# Patient Record
Sex: Female | Born: 1975 | Race: White | Hispanic: Yes | Marital: Married | Smoking: Never smoker
Health system: Southern US, Community
[De-identification: ages and names within clinical notes are randomized; demographics above are authoritative.]

## PROBLEM LIST (undated history)

## (undated) DIAGNOSIS — T7840XA Allergy, unspecified, initial encounter: Secondary | ICD-10-CM

## (undated) DIAGNOSIS — K8020 Calculus of gallbladder without cholecystitis without obstruction: Secondary | ICD-10-CM

## (undated) DIAGNOSIS — J3089 Other allergic rhinitis: Secondary | ICD-10-CM

## (undated) DIAGNOSIS — E669 Obesity, unspecified: Secondary | ICD-10-CM

## (undated) DIAGNOSIS — D509 Iron deficiency anemia, unspecified: Secondary | ICD-10-CM

## (undated) DIAGNOSIS — D1720 Benign lipomatous neoplasm of skin and subcutaneous tissue of unspecified limb: Secondary | ICD-10-CM

## (undated) DIAGNOSIS — E66811 Obesity, class 1: Secondary | ICD-10-CM

## (undated) DIAGNOSIS — E785 Hyperlipidemia, unspecified: Secondary | ICD-10-CM

## (undated) HISTORY — DX: Obesity, class 1: E66.811

## (undated) HISTORY — DX: Other allergic rhinitis: J30.89

## (undated) HISTORY — DX: Hyperlipidemia, unspecified: E78.5

## (undated) HISTORY — DX: Allergy, unspecified, initial encounter: T78.40XA

## (undated) HISTORY — DX: Obesity, unspecified: E66.9

## (undated) HISTORY — DX: Calculus of gallbladder without cholecystitis without obstruction: K80.20

## (undated) HISTORY — DX: Iron deficiency anemia, unspecified: D50.9

## (undated) HISTORY — DX: Benign lipomatous neoplasm of skin and subcutaneous tissue of unspecified limb: D17.20

---

## 2003-10-18 DIAGNOSIS — K8020 Calculus of gallbladder without cholecystitis without obstruction: Secondary | ICD-10-CM

## 2003-10-18 HISTORY — DX: Calculus of gallbladder without cholecystitis without obstruction: K80.20

## 2004-02-08 ENCOUNTER — Emergency Department (HOSPITAL_COMMUNITY): Admission: EM | Admit: 2004-02-08 | Discharge: 2004-02-08 | Payer: Self-pay | Admitting: Emergency Medicine

## 2004-06-28 ENCOUNTER — Encounter (INDEPENDENT_AMBULATORY_CARE_PROVIDER_SITE_OTHER): Payer: Self-pay | Admitting: Specialist

## 2004-06-28 ENCOUNTER — Inpatient Hospital Stay (HOSPITAL_COMMUNITY): Admission: RE | Admit: 2004-06-28 | Discharge: 2004-07-01 | Payer: Self-pay | Admitting: Obstetrics

## 2004-08-02 ENCOUNTER — Emergency Department (HOSPITAL_COMMUNITY): Admission: EM | Admit: 2004-08-02 | Discharge: 2004-08-03 | Payer: Self-pay | Admitting: *Deleted

## 2004-09-20 ENCOUNTER — Ambulatory Visit (HOSPITAL_COMMUNITY): Admission: RE | Admit: 2004-09-20 | Discharge: 2004-09-21 | Payer: Self-pay | Admitting: General Surgery

## 2004-09-20 ENCOUNTER — Encounter (INDEPENDENT_AMBULATORY_CARE_PROVIDER_SITE_OTHER): Payer: Self-pay | Admitting: Specialist

## 2008-02-15 LAB — CONVERTED CEMR LAB: Pap Smear: NORMAL

## 2008-05-22 ENCOUNTER — Ambulatory Visit: Payer: Self-pay | Admitting: Nurse Practitioner

## 2008-05-22 DIAGNOSIS — K029 Dental caries, unspecified: Secondary | ICD-10-CM

## 2009-01-13 ENCOUNTER — Telehealth (INDEPENDENT_AMBULATORY_CARE_PROVIDER_SITE_OTHER): Payer: Self-pay | Admitting: *Deleted

## 2009-01-22 ENCOUNTER — Ambulatory Visit: Payer: Self-pay | Admitting: Nurse Practitioner

## 2009-01-22 DIAGNOSIS — J302 Other seasonal allergic rhinitis: Secondary | ICD-10-CM

## 2009-01-22 DIAGNOSIS — J029 Acute pharyngitis, unspecified: Secondary | ICD-10-CM

## 2009-01-22 LAB — CONVERTED CEMR LAB: Rapid Strep: NEGATIVE

## 2009-02-06 ENCOUNTER — Encounter (INDEPENDENT_AMBULATORY_CARE_PROVIDER_SITE_OTHER): Payer: Self-pay | Admitting: Nurse Practitioner

## 2009-02-06 ENCOUNTER — Ambulatory Visit: Payer: Self-pay | Admitting: Nurse Practitioner

## 2009-02-06 LAB — CONVERTED CEMR LAB
Bilirubin Urine: NEGATIVE
Blood in Urine, dipstick: NEGATIVE
Glucose, Urine, Semiquant: NEGATIVE
KOH Prep: NEGATIVE
Ketones, urine, test strip: NEGATIVE
Nitrite: NEGATIVE
Protein, U semiquant: NEGATIVE
Specific Gravity, Urine: >=1.03
Urobilinogen, UA: 0.2
pH: 5

## 2009-02-09 ENCOUNTER — Encounter (INDEPENDENT_AMBULATORY_CARE_PROVIDER_SITE_OTHER): Payer: Self-pay | Admitting: Nurse Practitioner

## 2009-02-09 DIAGNOSIS — D649 Anemia, unspecified: Secondary | ICD-10-CM

## 2009-02-09 LAB — CONVERTED CEMR LAB: Retic Ct Pct: 2 % (ref 0.4–3.1)

## 2009-02-10 ENCOUNTER — Encounter (INDEPENDENT_AMBULATORY_CARE_PROVIDER_SITE_OTHER): Payer: Self-pay | Admitting: Nurse Practitioner

## 2009-02-10 LAB — CONVERTED CEMR LAB
ALT: 20 units/L (ref 0–35)
AST: 17 units/L (ref 0–37)
Albumin: 4.6 g/dL (ref 3.5–5.2)
Alkaline Phosphatase: 63 units/L (ref 39–117)
BUN: 11 mg/dL (ref 6–23)
Basophils Absolute: 0 10*3/uL (ref 0.0–0.1)
Basophils Relative: 0 % (ref 0–1)
CO2: 23 meq/L (ref 19–32)
Calcium: 9.7 mg/dL (ref 8.4–10.5)
Chlamydia, DNA Probe: NEGATIVE
Chloride: 104 meq/L (ref 96–112)
Cholesterol: 135 mg/dL (ref 0–200)
Creatinine, Ser: 0.57 mg/dL (ref 0.40–1.20)
Eosinophils Absolute: 0.3 10*3/uL (ref 0.0–0.7)
Eosinophils Relative: 4 % (ref 0–5)
GC Probe Amp, Genital: NEGATIVE
Glucose, Bld: 89 mg/dL (ref 70–99)
HCT: 32.9 % — ABNORMAL LOW (ref 36.0–46.0)
HDL: 37 mg/dL — ABNORMAL LOW (ref >39)
Hemoglobin: 10.4 g/dL — ABNORMAL LOW (ref 12.0–15.0)
LDL Cholesterol: 68 mg/dL (ref 0–99)
Lymphocytes Relative: 41 % (ref 12–46)
Lymphs Abs: 2.8 10*3/uL (ref 0.7–4.0)
MCHC: 31.6 g/dL (ref 30.0–36.0)
MCV: 57.4 fL — ABNORMAL LOW (ref 78.0–100.0)
Monocytes Absolute: 0.5 10*3/uL (ref 0.1–1.0)
Monocytes Relative: 8 % (ref 3–12)
Neutro Abs: 3.3 10*3/uL (ref 1.7–7.7)
Neutrophils Relative %: 48 % (ref 43–77)
Platelets: 264 10*3/uL (ref 150–400)
Potassium: 4.1 meq/L (ref 3.5–5.3)
RBC: 5.73 M/uL — ABNORMAL HIGH (ref 3.87–5.11)
RDW: 15.9 % — ABNORMAL HIGH (ref 11.5–15.5)
Sodium: 138 meq/L (ref 135–145)
TSH: 3.436 microintl units/mL (ref 0.350–4.500)
Total Bilirubin: 0.6 mg/dL (ref 0.3–1.2)
Total CHOL/HDL Ratio: 3.6
Total Protein: 7.8 g/dL (ref 6.0–8.3)
Triglycerides: 152 mg/dL — ABNORMAL HIGH (ref <150)
VLDL: 30 mg/dL (ref 0–40)
WBC: 7 10*3/uL (ref 4.0–10.5)

## 2009-10-17 HISTORY — PX: TUBAL LIGATION: SHX77

## 2010-09-24 ENCOUNTER — Ambulatory Visit: Payer: Self-pay | Admitting: Obstetrics & Gynecology

## 2010-09-24 ENCOUNTER — Encounter: Payer: Self-pay | Admitting: Obstetrics & Gynecology

## 2010-09-24 LAB — CONVERTED CEMR LAB
HCT: 32.6 % — ABNORMAL LOW (ref 36.0–46.0)
Hemoglobin: 10.3 g/dL — ABNORMAL LOW (ref 12.0–15.0)
MCHC: 31.6 g/dL (ref 30.0–36.0)
MCV: 59 fL — ABNORMAL LOW (ref 78.0–100.0)
Pap Smear: NEGATIVE
Platelets: 253 10*3/uL (ref 150–400)
RBC: 5.53 M/uL — ABNORMAL HIGH (ref 3.87–5.11)
RDW: 15.3 % (ref 11.5–15.5)
WBC: 6.3 10*3/uL (ref 4.0–10.5)

## 2010-09-29 ENCOUNTER — Ambulatory Visit (HOSPITAL_COMMUNITY)
Admission: RE | Admit: 2010-09-29 | Discharge: 2010-09-29 | Payer: Self-pay | Source: Home / Self Care | Attending: Family Medicine | Admitting: Family Medicine

## 2010-10-14 ENCOUNTER — Ambulatory Visit: Payer: Self-pay | Admitting: Obstetrics & Gynecology

## 2010-10-23 ENCOUNTER — Emergency Department (HOSPITAL_COMMUNITY)
Admission: EM | Admit: 2010-10-23 | Discharge: 2010-10-23 | Payer: Self-pay | Source: Home / Self Care | Admitting: Emergency Medicine

## 2011-03-04 NOTE — Op Note (Signed)
NAMEBENTLEY, HARALSON NO.:  [id number]   MEDICAL RECORD NO.:  [id number]          PATIENT TYPE:  OIB   LOCATION:  2859                         FACILITY:  [facility]   PHYSICIAN:  Gita Kudo, M.D. DATE OF BIRTH:  [birth year]   DATE OF PROCEDURE:  DATE OF DISCHARGE:                                 OPERATIVE REPORT   PREOPERATIVE DIAGNOSIS:  Cholecystitis, gallstones.   POSTOPERATIVE DIAGNOSIS:  Cholecystitis, gallstones.   OPERATION PERFORMED:  Laparoscopic cholecystectomy with intraoperative  cholangiogram.   SURGEON:  Gita Kudo, M.D.   ASSISTANT:  Rose Phi. Maple Hudson, M.D.   ANESTHESIA:  General endotracheal.   INDICATIONS FOR PROCEDURE:  Gallstones.   CLINICAL SUMMARY:  The patient is a 35 year old female brought in for  elective cholecystectomy.  She has had abdominal pain and gallbladder  ultrasound showed stones.  Liver functions are normal.   OPERATIVE FINDINGS:  The gallbladder was thin walled and had a few adhesions  around it.  The cystic duct and artery were normal in anatomy.  The  cholangiogram looked normal.   DESCRIPTION OF PROCEDURE:  Under satisfactory general endotracheal  anesthesia, having received 1 g Ancef preop, the patient was positioned,  prepped and draped in a standard fashion.  A total of 30 mL of 0.5% Marcaine  with epinephrine was infiltrated for postoperative analgesia.  Then through  the infiltrated skin sites, a transverse incision was made above the  umbilicus, midline opened into the peritoneum and controlled with a figure-  of-eight 0 Vicryl.  Operating Hasson port inserted, secured and good CO2  pneumoperitoneum established.  Under direct vision, two #5 ports placed  laterally and a second #10 medially.  Lateral port graspers gave excellent  exposure and we identified the gallbladder and adhesions and took the  adhesions down carefully.  Then the cystic duct and artery were each  identified and circumferentially  dissected.  Once certain of the anatomy,  the artery was controlled with multiple clips and a single clip placed on  the cystic duct near the gallbladder.  A percutaneous cholangiogram was then  inserted into the duct and a good film obtained.  Following this, the  catheter was withdrawn and the duct controlled with multiple clips and it  and the artery divided.  The gallbladder was then removed from below upward  using the coagulating hook for both hemostasis and dissection.  After the  gallbladder was removed, the liver bed was checked for hemostasis, made dry  by cautery, lavaged with saline and suctioned away.   Camera moved to the upper port and through the lower port, a large grasper  used to retrieve the gallbladder intact and without spillage or problem.  Then the operative site again checked, lavaged and suctioned.  Ports and CO2  released.  Midline closed with a previous figure-of-eight and a second  interrupted 0 Vicryl.  Subcutaneous approximated with 4-0 Vicryl and Steri-  Strips applied to all incisions.  Sterile absorbent dressings applied and  the patient went to the recovery room from the operating room in good  condition without complication.  MRL/MEDQ  D:  09/20/2004  T:  09/20/2004  Job:  [id number]

## 2011-03-04 NOTE — Op Note (Signed)
Nancy Howard, Nancy Howard                     ACCOUNT NO.:  [id number]   MEDICAL RECORD NO.:  [id number]                   PATIENT TYPE:  INP   LOCATION:  9123                                 FACILITY:  [facility]   PHYSICIAN:  Kathreen Cosier, M.D.           DATE OF BIRTH:  06-05-1976   DATE OF PROCEDURE:  06/28/2004  DATE OF DISCHARGE:                                 OPERATIVE REPORT   PREOPERATIVE DIAGNOSIS:  Previous cesarean section at term, desires repeat  and tubal ligation.   POSTOPERATIVE DIAGNOSIS:  Previous cesarean section at term, desires repeat  and tubal ligation.   OPERATION PERFORMED:   SURGEON:  Kathreen Cosier, M.D.   ASSISTANT:  Bing Neighbors. Clearance Coots, M.D.   ANESTHESIA:  Spinal.   DESCRIPTION OF PROCEDURE:  The patient was placed on the operating table in  supine position after the spinal administered.  The abdomen was prepped and  draped.  Bladder emptied with a Foley catheter.  Transverse suprapubic  incision made through the old scar, carried out onto the rectus fascia.  Fascia cleaned and incised the length of the incision.  Rectus muscles  retracted laterally.  Peritoneum incised longitudinally.  Transverse  incision made in the visceral peritoneum above the bladder.  Bladder  mobilized inferiorly.  Transverse lower uterine incision was made.  Patient  delivered from the OA position of a female, Apgar 8 and 9 weighing 7 pounds  and 5 ounces.  The placenta was posterior.  Removed manually, uterine cavity  cleaned with dry laps.  The uterine incision closed with one layer with  continuous suture of #1 chromic.  Hemostasis satisfactory.  The right tube  was grasped in midportion with a Babcock clamp, 0 plain suture placed in the  mesosalpinx below of the portion of tube and then clamped.  This was tied  and the tube cut.  Approximately one inch of tube was removed  Procedure  done in a similar fashion on the other side.  Lap and sponge count correct.  Abdomen closed in layers.  Peritoneum continuous suture of 0 chromic, fascia  continuous suture of 0 Dexon and the skin closed with subcuticular stitch of  3-0 Monocryl.  Blood loss 500 mL.                                               Kathreen Cosier, M.D.    BAM/MEDQ  D:  06/28/2004  T:  06/28/2004  Job:  [id number]

## 2011-03-04 NOTE — Discharge Summary (Signed)
NAMESHARICE, HARRISS                     ACCOUNT NO.:  [id number]   MEDICAL RECORD NO.:  [id number]                   PATIENT TYPE:  INP   LOCATION:  9123                                 FACILITY:   PHYSICIAN:  Kathreen Cosier, M.D.           DATE OF BIRTH:  07-18-1976   DATE OF ADMISSION:  06/28/2004  DATE OF DISCHARGE:  07/01/2004                                 DISCHARGE SUMMARY   The patient is a 34 year old, gravida 3, para 2-0-0-2 with two previous C-  sections.  Her EDC was on June 29, 2004 and she was admitted for a  repeat C-section and bilateral tubal ligation.  On June 28, 2004 she  underwent repeat low transverse cesarean section.  She had a female with  Apgar's of 8 and 9 from the OA position weighing 7 pounds 5 ounces.  She had  a bilateral tubal ligation performed.  Postoperatively, she did well.  She  was discharged on the third postoperative day, ambulatory, on a regular  diet, to see me in six weeks.   DISCHARGE DIAGNOSIS:  Status post repeat low transverse cesarean section and  bilateral tubal ligation, at term.                                               Kathreen Cosier, M.D.    BAM/MEDQ  D:  07/01/2004  T:  07/01/2004  Job:  [id number]

## 2012-10-22 ENCOUNTER — Ambulatory Visit: Payer: Self-pay | Admitting: Obstetrics and Gynecology

## 2012-10-29 ENCOUNTER — Encounter: Payer: Self-pay | Admitting: Advanced Practice Midwife

## 2012-10-29 ENCOUNTER — Ambulatory Visit (INDEPENDENT_AMBULATORY_CARE_PROVIDER_SITE_OTHER): Payer: Self-pay | Admitting: Advanced Practice Midwife

## 2012-10-29 VITALS — BP 158/95 | HR 71 | Temp 96.7°F | Ht 60.0 in | Wt 134.5 lb

## 2012-10-29 DIAGNOSIS — Z0000 Encounter for general adult medical examination without abnormal findings: Secondary | ICD-10-CM

## 2012-10-29 NOTE — Progress Notes (Signed)
Patient ID: Nancy Howard, female   DOB: 11/26/1975, 37 y.o.   MRN: [id number] Pt here for breast exam and pap today. No h/o abnl pap. Denies concerns. Offered patient option of going to free cancer screening coming up at [facility] on 11/19/12 instead, considering that she has no insurance. Pt states she would like to do that. No exam done today.

## 2012-10-29 NOTE — Patient Instructions (Signed)
Call [phone]

## 2013-11-08 ENCOUNTER — Ambulatory Visit: Payer: Self-pay | Admitting: Medical

## 2014-08-18 ENCOUNTER — Encounter: Payer: Self-pay | Admitting: Advanced Practice Midwife

## 2014-08-27 ENCOUNTER — Encounter: Payer: Self-pay | Admitting: Obstetrics & Gynecology

## 2014-08-27 ENCOUNTER — Ambulatory Visit (INDEPENDENT_AMBULATORY_CARE_PROVIDER_SITE_OTHER): Payer: Self-pay | Admitting: Obstetrics & Gynecology

## 2014-08-27 VITALS — BP 124/89 | HR 73 | Temp 97.9°F | Resp 20 | Ht <= 58 in | Wt 134.3 lb

## 2014-08-27 DIAGNOSIS — R102 Pelvic and perineal pain: Secondary | ICD-10-CM

## 2014-08-27 MED ORDER — IBUPROFEN 600 MG PO TABS: 600.0000 mg | ORAL_TABLET | Freq: Four times a day (QID) | ORAL | Status: DC | PRN

## 2014-08-27 NOTE — Progress Notes (Signed)
[place] # [phone] used for nursing check in of medical information.  Zenda Alpers present for exam and visit with Dr. Ihor Dow

## 2014-08-27 NOTE — Patient Instructions (Signed)
Fibroma uterino (Uterine Fibroid) Un fibroma uterino es un crecimiento (tumor) dentro del tero. Este tipo de tumor no es canceroso y no se extiende fuera del tero. Podr tener uno o varios fibromas. Los fibromas pueden variar en tamao, peso y el lugar en que se desarrollan dentro del tero. Algunos pueden llegar a ser bastante grandes. La mayora de los fibromas no necesitan tratamiento mdico, pero algunos pueden causar dolor o sangrado abundante durante los perodos y entre ellos. CAUSAS  Un fibroma es el resultado del desarrollo continuo de una nica clula uterina que sigue creciendo (no regulada) que es diferente al resto de las clulas del cuerpo humano. La mayora de las clulas tiene un mecanismo de control que evita que se reproduzcan de manera descontrolada.  SIGNOS Y SNTOMAS   Hemorragias.  Dolor y sensacin de presin en la pelvis.  Problemas en la vejiga debido al tamao del fibroma.  Infertilidad y abortos espontneos, segn el tamao y la ubicacin del fibroma. DIAGNSTICO  Los fibromas uterinos se diagnostican con un examen fsico. El mdico puede palpar los tumores abultados al realizar el examen de la pelvis. Una ecografa puede indicarse para tener informacin del tamao, la ubicacin y el nmero de tumores.  TRATAMIENTO   El mdico puede considerar que es conveniente esperar y prestar atencin. Esto incluye el control del fibroma por parte del mdico para observar si crece o disminuye su tamao.  Podr indicarle un tratamiento hormonal o el uso de un dispositivo intrauterino (DIU).  En algunos casos es necesaria la ciruga para extirpar el fibroma (miomectoma) o el tero (histerectoma). Esto depender de su situacin. Cuando una mujer desea quedar embarazada y los fibromas interfieren en su fertilidad, el mdico puede recomendar la extirpacin del fibroma.  INSTRUCCIONES PARA EL CUIDADO EN EL HOGAR  Los cuidados en el hogar dependen del tratamiento que haya  recibido. En general:   Cumpla con todas las visitas de control, segn le indique su mdico.  Tome slo medicamentos de venta libre o recetados, segn las indicaciones del mdico. Si le recetaron un tratamiento hormonal, tome los medicamentos hormonales como le indicaron. No tome aspirina. Puede ocasionar hemorragias.  Consulte al mdico si debe tomar pldoras de hierro.  Si sus perodos son molestos pero no tan abundantes, acustese con los pies ligeramente elevados por encima del nivel del corazn. Coloque compresas fras en la zona inferior del abdomen.  Si sus perodos son muy abundantes, anote el nmero de compresas o tampones que usa cada mes. Lleve esta informacin a su consulta mdica.  Incluya vegetales verdes en su dieta. SOLICITE ATENCIN MDICA DE INMEDIATO SI:  Siente dolor o clicos en la pelvis y no puede controlarlos con los medicamentos.  El dolor en la pelvis aumenta de manera repentina.  Aumenta el sangrado entre los perodos o durante los mismos.  Si tiene perodos muy abundantes y debe cambiar un tampn o una toalla higinica cada media hora o menos.  Se siente mareado o tiene episodios de desmayo. Document Released: 10/03/2005 Document Revised: 07/24/2013 ExitCare Patient Information 2015 [employer]. This information is not intended to replace advice given to you by your health care provider. Make sure you discuss any questions you have with your health care provider.  

## 2014-08-27 NOTE — Progress Notes (Signed)
Subjective:     Patient ID: Nancy Howard, female   DOB: 06-14-1976, 38 y.o.   MRN: [id number]  HPI Pt c/o pain in her pelvis.  She reports that it is intermittent.  It has occurred for 1 year. It is not worse with menses or intercourse.  Pt has had a BTL.  Pt denies vaginal discharge.  She reports that menses are irregular with cycles usually occuring 2-3 months.   This has occurred since menarche.  She denies heavy bleeding when she is having menses.        Review of Systems     Objective:   Physical Exam BP 124/89 mmHg  Pulse 73  Temp(Src) 97.9 F (36.6 C) (Oral)  Resp 20  Ht 4' 9.5" (1.461 m)  Wt 134 lb 4.8 oz (60.918 kg)  BMI 28.54 kg/m2  LMP 08/10/2014 (Exact Date) Lungs: CTA CV: RRR Abd: soft, NT; ND.  Well healed transverse incision with small infraumbilical incision GU: EGBUS: no lesions Vagina: no blood in vault Cervix: no lesion; no mucopurulent d/c Uterus: enlarged- difficult to assess due to tense abd Adnexa: no masses; non tender          Assessment:     Pelvic pain; suspect uterine fibroids     Plan:     Motrin 600mg  prn q 6 hours with food Pelvic sono F/u in 4 weeks or sooner prn

## 2014-08-28 ENCOUNTER — Ambulatory Visit (HOSPITAL_COMMUNITY)
Admission: RE | Admit: 2014-08-28 | Discharge: 2014-08-28 | Disposition: A | Discharge status: Home / Self Care | Payer: Self-pay | Source: Ambulatory Visit | Attending: Obstetrics & Gynecology | Admitting: Obstetrics & Gynecology

## 2014-08-28 DIAGNOSIS — R102 Pelvic and perineal pain: Secondary | ICD-10-CM

## 2014-08-28 DIAGNOSIS — D259 Leiomyoma of uterus, unspecified: Secondary | ICD-10-CM | POA: Insufficient documentation

## 2014-10-01 ENCOUNTER — Ambulatory Visit: Payer: Self-pay | Admitting: Obstetrics & Gynecology

## 2014-11-14 ENCOUNTER — Encounter: Payer: Self-pay | Admitting: Obstetrics & Gynecology

## 2014-11-14 ENCOUNTER — Ambulatory Visit (INDEPENDENT_AMBULATORY_CARE_PROVIDER_SITE_OTHER): Payer: Self-pay | Admitting: Obstetrics & Gynecology

## 2014-11-14 VITALS — BP 110/74 | HR 73 | Temp 98.0°F | Ht <= 58 in | Wt 139.0 lb

## 2014-11-14 DIAGNOSIS — D250 Submucous leiomyoma of uterus: Secondary | ICD-10-CM

## 2014-11-14 NOTE — Patient Instructions (Signed)
Fibroma uterino (Uterine Fibroid) Un fibroma uterino es un crecimiento (tumor) dentro del tero. Este tipo de tumor no es Radio broadcast assistant y no se extiende fuera del tero. Podr tener uno o varios fibromas. Los fibromas pueden variar en tamao, peso y TEFL teacher en que se desarrollan dentro del tero. Algunos pueden llegar a ser bastante grandes. La mayora de los fibromas no necesitan tratamiento mdico, pero algunos pueden causar dolor o sangrado abundante durante los perodos y [place]. CAUSAS  Un fibroma es el resultado del desarrollo continuo de una nica clula uterina que sigue creciendo (no regulada) que es diferente al resto de las clulas del cuerpo humano. La mayora de las clulas tiene un mecanismo de control que evita que se reproduzcan de Research officer, trade union.  SIGNOS Y SNTOMAS   Hemorragias.  Dolor y sensacin de presin en la pelvis.  Problemas en la vejiga debido al tamao del fibroma.  Infertilidad y abortos espontneos, segn el tamao y la ubicacin del fibroma. DIAGNSTICO  Los fibromas uterinos se diagnostican con un examen fsico. El mdico puede palpar los tumores abultados al realizar el examen de la pelvis. Una ecografa puede indicarse para tener informacin del tamao, la ubicacin y el nmero de tumores.  TRATAMIENTO   El mdico puede considerar que es conveniente esperar y Barrister's clerk. Esto incluye el control del fibroma por parte del mdico para observar si crece o disminuye su tamao.  Podr indicarle un tratamiento hormonal o el uso de un dispositivo intrauterino (DIU).  En algunos casos es necesaria la ciruga para extirpar el fibroma (miomectoma) o el tero (histerectoma). Esto depender de su situacin. Cuando una mujer desea quedar embarazada y los fibromas interfieren en su fertilidad, el mdico puede recomendar la extirpacin del fibroma.  INSTRUCCIONES PARA EL CUIDADO EN EL HOGAR  Los cuidados en el hogar dependen del tratamiento que haya  recibido. En general:   Cumpla con todas las visitas de control, segn le indique su mdico.  Tome slo medicamentos de venta libre o recetados, segn las indicaciones del mdico. Si le recetaron un tratamiento hormonal, tome los medicamentos hormonales como le indicaron. No tome aspirina. Puede ocasionar hemorragias.  Consulte al mdico si debe tomar pldoras de hierro.  Si sus perodos son molestos pero no tan abundantes, acustese con los pies ligeramente elevados por encima del nivel del corazn. Coloque compresas fras en la zona inferior del abdomen.  Si sus perodos son muy abundantes, anote el nmero de compresas o tampones que [place] cada mes. Lleve esta informacin a su consulta mdica.  Incluya vegetales verdes en su dieta. SOLICITE ATENCIN MDICA DE INMEDIATO SI:  Siente dolor o clicos en la pelvis y no puede controlarlos con los medicamentos.  El dolor en la pelvis aumenta de manera repentina.  Aumenta el sangrado entre los perodos o [employer].  Si tiene perodos muy abundantes y debe cambiar un tampn o una toalla higinica cada media hora o menos.  Se siente mareado o tiene episodios de [place]. Document Released: 10/03/2005 Document Revised: 07/24/2013 [facility] Patient Information 2015 [place], [place]. This information is not intended to replace advice given to you by your health care provider. Make sure you discuss any questions you have with your health care provider.

## 2014-11-14 NOTE — Progress Notes (Signed)
Patient reports she has not had any more pelvic pain. Language Line Spanish interpreter used.

## 2014-11-14 NOTE — Progress Notes (Signed)
Subjective:     Patient ID: Nancy Howard, female   DOB: [birth year], 39 y.o.   MRN: [id number]  [id number] Patient's last menstrual period was 11/13/2014 (exact date). NO pain or menorrhagia now. US showed submucosal fibroid  No past medical history on file. Past Surgical History  Procedure Laterality Date  . Tubal ligation    . Cesarean section  06/10/1998, 01/29/2003    06/28/2004   No Known Allergies  Review of Systems  Constitutional: Negative.   Gastrointestinal: Negative.   Genitourinary: Positive for vaginal bleeding. Negative for flank pain and menstrual problem.       Objective:   Physical Exam  Constitutional: She is oriented to person, place, and time. She appears well-developed. No distress.  Neurological: She is alert and oriented to person, place, and time.  Psychiatric: She has a normal mood and affect. Her behavior is normal.      CLINICAL DATA: Pelvic pain x1 year, irregular menses  EXAM: TRANSABDOMINAL AND TRANSVAGINAL ULTRASOUND OF PELVIS  TECHNIQUE: Both transabdominal and transvaginal ultrasound examinations of the pelvis were performed. Transabdominal technique was performed for global imaging of the pelvis including uterus, ovaries, adnexal regions, and pelvic cul-de-sac. It was necessary to proceed with endovaginal exam following the transabdominal exam to visualize the endometrium.  COMPARISON: 09/29/2010  FINDINGS: Uterus  Measurements: 10.3 x 5.3 x 6.2 cm. 4.0 x 3.0 x 3.0 cm submucosal fibroid in the right uterine fundus.  Endometrium  Thickness: 10 mm. No focal abnormality visualized.  Right ovary  Measurements: 1.9 x 1.3 x 2.7 cm. Normal appearance/no adnexal mass.  Left ovary  Measurements: 3.2 x 2.0 x 2.0 cm. Normal appearance/no adnexal mass.  Other findings  No free fluid.  IMPRESSION: 4.0 cm submucosal fibroid in the right uterine fundus.  Endometrial complex measures 10  mm.   Electronically Signed  By: Julian Hy M.D.  On: 08/28/2014 14:52 Assessment:     Fibroid uterus no sx now     Plan:     F/u 6 months  Woodroe Mode, MD 11/14/2014

## 2015-08-13 ENCOUNTER — Ambulatory Visit: Payer: Self-pay | Admitting: Internal Medicine

## 2015-08-28 ENCOUNTER — Encounter: Payer: Self-pay | Admitting: Internal Medicine

## 2015-08-28 ENCOUNTER — Ambulatory Visit (INDEPENDENT_AMBULATORY_CARE_PROVIDER_SITE_OTHER): Payer: No Typology Code available for payment source | Admitting: Internal Medicine

## 2015-08-28 VITALS — BP 134/90 | HR 78 | Ht <= 58 in | Wt 139.0 lb

## 2015-08-28 DIAGNOSIS — R42 Dizziness and giddiness: Secondary | ICD-10-CM

## 2015-08-28 NOTE — Progress Notes (Signed)
   Subjective:    Patient ID: Nancy Howard, female    DOB: 04/23/1976, 39 y.o.   MRN: [id number]  HPI  1.  Dizziness:  2 episodes.  First about 1 month ago.  First episode occurred in the morning after taking a nap following getting her kids off to school.  Stood up from bed and felt like the room was spinning.  Lasted less than a minute, she sat down and resolved. Second episode was a week ago.  Was sitting and went to stand, does not recall if she turned her head one way or the other, and had again episode of spinning that lasted less than a minute.  Sat back down and quickly resolved.   Had a left ear infection about 3 months ago, treated with antibiotics.  Has not had any congestion, itchy watery nose, eyes, throat more recently.   No loss of hearing, no nausea or vomiting with dizziness.  No family history of hearing loss.  2.  Elevated BP:  Has never been told her bp is high.  She is anxious today.  Gets anxious coming to a doctor.      Review of Systems     Objective:   Physical Exam  Constitutional: She is oriented to person, place, and time. She appears well-developed and well-nourished.  HENT:  Head: Normocephalic and atraumatic.  Right Ear: Tympanic membrane normal.  Left Ear: No swelling. Tympanic membrane is retracted. Tympanic membrane is not erythematous.  Nose: No mucosal edema or rhinorrhea.  Mouth/Throat: Uvula is midline, oropharynx is clear and moist and mucous membranes are normal.  Eyes: Conjunctivae and EOM are normal. Pupils are equal, round, and reactive to light.  Discs sharp  Neck: Normal range of motion. Neck supple. No thyromegaly present.  Cardiovascular: Normal rate, regular rhythm and normal heart sounds.   No carotid bruits Carotid, radial and DP pulse normal and equal  Pulmonary/Chest: Effort normal and breath sounds normal.  Lymphadenopathy:    She has no cervical adenopathy.  Neurological: She is alert and oriented to person, place, and  time. She has normal strength and normal reflexes. No cranial nerve deficit or sensory deficit. She displays a negative Romberg sign. Coordination and gait normal.  Hall Pike maneuver negative for nystagmus or symptoms bilaterally          Assessment & Plan:  Vertigo: Appears peripheral--no concerning findings.   Possibly related to recent left ear infection.  Discussed should gradually get less frequent, but to call if worsens. Schedule for CPE Pt. Brings up headaches she has had for years that she would like to discuss then as well.

## 2015-08-28 NOTE — Patient Instructions (Addendum)
Call if increasing symptoms of dizzinessVrtigo (Vertigo) El trmino vrtigo hace referencia a la sensacin de que se est moviendo cuando no es as. El vrtigo puede causarle la sensacin de que las cosas que lo rodean se estn moviendo cuando en realidad eso no sucede. Esta sensacin puede aparecer y desaparecer en cualquier momento. El vrtigo suele desaparecer solo. CUIDADOS EN EL HOGAR  No haga movimientos rpidos.  No conduzca.  No use maquinaria pesada.  No haga nada que pueda ser peligroso para usted o para Producer, television/film/video en el caso de que se produjera una crisis de vrtigo.  Sintese de inmediato si est mareado o tiene dificultad para mantener el equilibrio.  Tome los medicamentos de venta libre y los recetados solamente como se lo haya indicado el mdico.  Siga las indicaciones de mdico en lo que respecta a las posiciones o los movimientos que Nurse, adult.  Beba suficiente lquido para mantener el pis (orina) claro o de color amarillo plido.  Concurra a todas las visitas de control como se lo haya indicado el mdico. Esto es importante. SOLICITE AYUDA SI:  Los medicamentos no le [employer] vrtigo.  Tiene fiebre.  Los problemas empeoran o le aparecen sntomas nuevos.  Sus familiares o amigos observan cambios en su comportamiento.  Tiene malestar estomacal (nuseas) o vomita.  Tiene sensacin de hormigueo o se le adormece una parte del cuerpo. SOLICITE AYUDA DE INMEDIATO SI:  Tiene dificultad para moverse o para caminar.  Esta mareado todo [employer].  Pierde el conocimiento (se desmaya).  Tiene dolores de [place] muy intensos.  Se siente dbil o tiene problemas para [employer], los brazos o las piernas.  Tiene cambios en la audicin.  Tiene cambios en la visin.  Tiene rigidez en el cuello.  La luz brillante empieza a molestarlo.   Esta informacin no tiene Marine scientist el consejo del mdico. Asegrese de hacerle al mdico cualquier  pregunta que tenga.   Document Released: 11/05/2010 Document Revised: 06/24/2015 Elsevier Interactive Patient Education [employer].

## 2015-10-01 ENCOUNTER — Encounter: Payer: Self-pay | Admitting: Internal Medicine

## 2015-10-01 ENCOUNTER — Ambulatory Visit (INDEPENDENT_AMBULATORY_CARE_PROVIDER_SITE_OTHER): Payer: No Typology Code available for payment source | Admitting: Internal Medicine

## 2015-10-01 VITALS — BP 132/88 | HR 72 | Ht <= 58 in | Wt 140.0 lb

## 2015-10-01 DIAGNOSIS — M542 Cervicalgia: Secondary | ICD-10-CM

## 2015-10-01 MED ORDER — CYCLOBENZAPRINE HCL 5 MG PO TABS
ORAL_TABLET | ORAL | Status: DC
Start: 2015-10-01 — End: 2015-11-12

## 2015-10-01 MED ORDER — DICLOFENAC SODIUM 75 MG PO TBEC: 75.0000 mg | DELAYED_RELEASE_TABLET | Freq: Two times a day (BID) | ORAL | Status: DC

## 2015-10-01 NOTE — Patient Instructions (Addendum)
Call if your pain does not gradually improve over next 1-2 weeks Gentle stretching and range of motion--keep moving! Using heating pad on medium heat for up to 20 minutes at a time to help with pain Watch your posture

## 2015-10-01 NOTE — Progress Notes (Signed)
   Subjective:    Patient ID: Nancy Howard, female    DOB: 01-Nov-1975, 39 y.o.   MRN: [id number]  HPI  Right shoulder, back and upper arm have been hurting for about 8 days.  Does not recall any overuse prior to the pain starting.  No history of injury.  Just awakened with the pain one morning and the pain is not going away, though is improving a bit with time.   States the pain is worse before she gets up and starts moving around.  No numbness, tingling, or weakness in hand.   Has been taking Naproxen (Flanax) maybe 225 mg --tried 2-3 times early on, but did not help, so stopped.  Has helped with other forms of pain in the past.    Review of Systems     Objective:   Physical Exam  Pt. Holding shoulders up around neck in discomfort Neck:  Supple, no adenopathy Chest:  CTA CV:  RRR MS: Full ROM of neck, but stiff and obviously uncomfortable.Tender over C5-6 spinous processes mildly so.  Most tender over left trap and cervical/thoracic paraspinous musculature--from nuchal ridge out to shoulder, medial and just inferior to scapula.  Full ROM of shoulders. Mild tenderness of AC joint, and deltoid insertion to proximal humerus. NT over subacromial bursa Neuro:  A & O x3, CN II-XII intact.  MOtor 5/5, DTRs 2+/5 throughout.  Gait normal.        Assessment & Plan:  Neck and thoracic back pain:  Muscular.  Flexeril and Diclofenac.  Warm compresses and stretching exercise given.  To also do gentle ROM. Call if does not improve over next week.

## 2015-10-29 ENCOUNTER — Encounter: Payer: Self-pay | Admitting: Internal Medicine

## 2015-11-12 ENCOUNTER — Encounter: Payer: Self-pay | Admitting: Internal Medicine

## 2015-11-12 ENCOUNTER — Ambulatory Visit (INDEPENDENT_AMBULATORY_CARE_PROVIDER_SITE_OTHER): Payer: Self-pay | Admitting: Internal Medicine

## 2015-11-12 VITALS — BP 126/80 | HR 70 | Ht <= 58 in | Wt 138.0 lb

## 2015-11-12 DIAGNOSIS — E785 Hyperlipidemia, unspecified: Secondary | ICD-10-CM

## 2015-11-12 DIAGNOSIS — Z0000 Encounter for general adult medical examination without abnormal findings: Secondary | ICD-10-CM

## 2015-11-12 DIAGNOSIS — D649 Anemia, unspecified: Secondary | ICD-10-CM

## 2015-11-12 DIAGNOSIS — Z124 Encounter for screening for malignant neoplasm of cervix: Secondary | ICD-10-CM

## 2015-11-12 DIAGNOSIS — J3089 Other allergic rhinitis: Secondary | ICD-10-CM

## 2015-11-12 MED ORDER — MOMETASONE FUROATE 50 MCG/ACT NA SUSP: NASAL | Status: DC

## 2015-11-12 MED ORDER — DESLORATADINE 5 MG PO TABS
ORAL_TABLET | ORAL | Status: DC
Start: 2015-11-12 — End: 2015-11-19

## 2015-11-12 NOTE — Patient Instructions (Signed)

## 2015-11-12 NOTE — Progress Notes (Signed)
Subjective:    Patient ID: Nancy Howard, female    DOB: 22-Oct-1975, 40 y.o.   MRN: [id number]  HPI  Here for CPE.  Health Maintenance:  1.  Immunizations:  Td:  Cannot recall, thinks around 10 years ago. Clinic currently without Tdap. Did not get flu vaccine this past fall.    2.  Pap smear:  Last 4 years ago.  Always normal.  3.  Mammogram:  Never.  4.  SBE:  Occasionally.   5.  Cholesterol:  Last checked in 2010 with dyslipidemia.  Total of 135, Trifs 152, HDL 37, LDL 68.  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     Review of Systems  Constitutional: Negative for fatigue.  HENT: Positive for congestion and ear pain. Negative for dental problem.        Congestion related to allergies  Right ear pain after bathing.  History of TM perforation 1 year ago.  Now pain after bathing at times.    Eyes: Negative for visual disturbance.  Respiratory: Negative for shortness of breath.   Cardiovascular: Negative for chest pain and leg swelling.  Gastrointestinal: Positive for abdominal pain.       Low abdominal pain--sounds infrequent. Resolves soon after taking 400 mg Advil  Genitourinary: Positive for vaginal discharge. Negative for dysuria and frequency.       Generally after period--white. No associated burning or itching. No odor No pelvic pain  Musculoskeletal: Negative for myalgias, joint swelling and arthralgias.       Neck pain resolved.  Skin: Negative for rash.  Neurological: Negative for weakness and numbness.       Objective:   Physical Exam  Constitutional: She is oriented to person, place, and time. She appears well-developed and well-nourished.  HENT:  Head: Normocephalic and atraumatic.  Right Ear: External ear normal. Tympanic membrane is retracted. Tympanic membrane is not erythematous.  Left Ear: External ear normal. Tympanic membrane is retracted. Tympanic membrane is not erythematous.  Nose: Mucosal edema present.  Mouth/Throat: Uvula  is midline and mucous membranes are normal. Normal dentition.  Posterior pharyngeal cobbling  Eyes: Conjunctivae and EOM are normal. Pupils are equal, round, and reactive to light.  Discs sharp bilaterally  Neck: Normal range of motion. Neck supple. No thyromegaly present.  Cardiovascular: Normal rate, regular rhythm, S1 normal and S2 normal.  Exam reveals no S3, no S4 and no friction rub.   No murmur heard. No carotid bruits.  Carotid, radial, femoral, DP and PT pulses normal and equal.    Pulmonary/Chest: Effort normal and breath sounds normal.  Breasts:   without focal mass, skin dimpling, nipple discharge or axillary adenopathy.    Abdominal: Soft. Bowel sounds are normal. She exhibits no mass. There is no hepatosplenomegaly.  Bilateral lower quadrants mildly tender bilaterally  Genitourinary:  Genitalia: External genitalia normal without lesion.  No vaginal lesions or discharge. Vaginal mucosa moist. Cervix without lesion.  Pap taken.  No CMT.  No uterine or adnexal mass or tenderness.    Musculoskeletal: Normal range of motion.  Lymphadenopathy:    She has no cervical adenopathy.    She has no axillary adenopathy.       Right: No inguinal adenopathy present.       Left: No inguinal adenopathy present.  Neurological: She is alert and oriented to person, place, and time. She has normal strength and normal reflexes. She displays normal reflexes. No cranial nerve deficit.  Skin: Skin is warm and dry.  Psychiatric: She has a normal mood and affect.          Assessment & Plan:  1.  CPE with pap Pap, FLP, CBC, CMP today Will have her return for Tdap when available. Recommend yearly flu vaccine. Encouraged to work on diet and physical activity with BMI  2.  Environmental and seasonal allergies With related eustachian tube dyfunction Nasonex Clarinex  3.  Dyslipidemia:  FLP  4.  History of anemia with relatively high RBC count and significant microcytosis B thal trait?   Recheck

## 2015-11-13 LAB — CBC WITH DIFFERENTIAL/PLATELET
BASOS ABS: 0 10*3/uL (ref 0.0–0.2)
BASOS: 0 %
EOS (ABSOLUTE): 0.2 10*3/uL (ref 0.0–0.4)
Eos: 2 %
Hematocrit: 34.3 % (ref 34.0–46.6)
Hemoglobin: 11 g/dL — ABNORMAL LOW (ref 11.1–15.9)
Immature Grans (Abs): 0 10*3/uL (ref 0.0–0.1)
Immature Granulocytes: 0 %
Lymphocytes Absolute: 2.3 10*3/uL (ref 0.7–3.1)
Lymphs: 30 %
MCH: 18.6 pg — ABNORMAL LOW (ref 26.6–33.0)
MCHC: 32.1 g/dL (ref 31.5–35.7)
MCV: 58 fL — ABNORMAL LOW (ref 79–97)
Monocytes Absolute: 0.7 10*3/uL (ref 0.1–0.9)
Monocytes: 9 %
NEUTROS PCT: 59 %
Neutrophils Absolute: 4.4 10*3/uL (ref 1.4–7.0)
PLATELETS: 286 10*3/uL (ref 150–379)
RBC: 5.91 x10E6/uL — ABNORMAL HIGH (ref 3.77–5.28)
RDW: 16.1 % — ABNORMAL HIGH (ref 12.3–15.4)
WBC: 7.6 10*3/uL (ref 3.4–10.8)

## 2015-11-13 LAB — COMPREHENSIVE METABOLIC PANEL
A/G RATIO: 1.6 (ref 1.1–2.5)
ALT: 16 IU/L (ref 0–32)
AST: 16 IU/L (ref 0–40)
Albumin: 4.4 g/dL (ref 3.5–5.5)
Alkaline Phosphatase: 59 IU/L (ref 39–117)
BUN/Creatinine Ratio: 20 (ref 8–20)
BUN: 11 mg/dL (ref 6–20)
Bilirubin Total: 0.4 mg/dL (ref 0.0–1.2)
CALCIUM: 9.3 mg/dL (ref 8.7–10.2)
CO2: 21 mmol/L (ref 18–29)
Chloride: 101 mmol/L (ref 96–106)
Creatinine, Ser: 0.55 mg/dL — ABNORMAL LOW (ref 0.57–1.00)
GFR calc Af Amer: 138 mL/min/{1.73_m2} (ref >59)
GFR, EST NON AFRICAN AMERICAN: 119 mL/min/{1.73_m2} (ref >59)
GLOBULIN, TOTAL: 2.8 g/dL (ref 1.5–4.5)
Glucose: 89 mg/dL (ref 65–99)
Potassium: 4 mmol/L (ref 3.5–5.2)
SODIUM: 138 mmol/L (ref 134–144)
Total Protein: 7.2 g/dL (ref 6.0–8.5)

## 2015-11-13 LAB — LIPID PANEL W/O CHOL/HDL RATIO
Cholesterol, Total: 142 mg/dL (ref 100–199)
HDL: 37 mg/dL — ABNORMAL LOW (ref >39)
LDL Calculated: 76 mg/dL (ref 0–99)
Triglycerides: 145 mg/dL (ref 0–149)
VLDL CHOLESTEROL CAL: 29 mg/dL (ref 5–40)

## 2015-11-13 LAB — CYTOLOGY - PAP

## 2015-11-16 ENCOUNTER — Telehealth: Payer: Self-pay | Admitting: Internal Medicine

## 2015-11-16 DIAGNOSIS — Z9109 Other allergy status, other than to drugs and biological substances: Secondary | ICD-10-CM

## 2015-11-16 NOTE — Telephone Encounter (Signed)
Pat received call from [place] [facility].  Patient at their pharmacy to pick up Rx CLARINEX 5 mg. Tablet.  Pharmacist called for approval to change Rx to Cetirizine 10mg .  Fraser Din spoke with Dr. Amil Amen and obtained verbal permission for change in Rx.  Pat relayed that message of Rx change to [facility].

## 2015-11-19 MED ORDER — CETIRIZINE HCL 10 MG PO TABS: 10.0000 mg | ORAL_TABLET | Freq: Every day | ORAL | Status: DC

## 2015-11-19 NOTE — Addendum Note (Signed)
Addended by: Marcelino Duster on: 11/19/2015 09:04 AM   Modules accepted: Orders, Medications

## 2015-11-19 NOTE — Telephone Encounter (Signed)
Medication change made in chart

## 2015-12-25 ENCOUNTER — Ambulatory Visit (INDEPENDENT_AMBULATORY_CARE_PROVIDER_SITE_OTHER): Payer: Self-pay | Admitting: Internal Medicine

## 2015-12-25 ENCOUNTER — Encounter: Payer: Self-pay | Admitting: Internal Medicine

## 2015-12-25 VITALS — BP 108/72 | HR 70 | Ht <= 58 in | Wt 137.0 lb

## 2015-12-25 DIAGNOSIS — K029 Dental caries, unspecified: Secondary | ICD-10-CM

## 2015-12-25 DIAGNOSIS — J3089 Other allergic rhinitis: Secondary | ICD-10-CM

## 2015-12-25 DIAGNOSIS — Z9109 Other allergy status, other than to drugs and biological substances: Secondary | ICD-10-CM

## 2015-12-25 DIAGNOSIS — Z23 Encounter for immunization: Secondary | ICD-10-CM

## 2015-12-25 DIAGNOSIS — Z91048 Other nonmedicinal substance allergy status: Secondary | ICD-10-CM

## 2015-12-25 MED ORDER — MOMETASONE FUROATE 50 MCG/ACT NA SUSP: NASAL | Status: DC

## 2015-12-25 MED ORDER — CETIRIZINE HCL 10 MG PO TABS: 10.0000 mg | ORAL_TABLET | Freq: Every day | ORAL | Status: DC

## 2015-12-25 NOTE — Progress Notes (Signed)
   Subjective:    Patient ID: Nancy Howard, female    DOB: Aug 31, 1976, 40 y.o.   MRN: [id number]  HPI  1.  Dental concerns:  2 months ago, a piece of her right premolar broke off. She did have pain at the time, but not now.  Did not have a filling in the tooth.  Has not been to the Dental Clinic before.   2.  Environmental and seasonal allergies:  Did not get Nasonex filled with MAP.  Did not realize she could refill her Zyrtec.  1.      Current outpatient prescriptions:  .  cetirizine (ZYRTEC) 10 MG tablet, Take 1 tablet (10 mg total) by mouth daily., Disp: 30 tablet, Rfl: 11 .  mometasone (NASONEX) 50 MCG/ACT nasal spray, 2 sprays each nostril once daily, Disp: 17 g, Rfl: 11  Allergies: NKDA  Review of Systems     Objective:   Physical Exam HEENT:  PERRL, EOMI, TMs pearly gray, throat without injection, nasal mucosa boggy/swollen with clear discharge, NT over frontal and Maxillary sinuses.  Caried right upper premolar broken at gumline.  No surrounding swelling or redness. Neck:  Supple, No adenopathy Chest:  CTA CV:  RRR without murmur or rub       Assessment & Plan:  1.  Dental Decay with broken tooth:  Referral to Dental Clinic  2.  Environmental and Seasonal Allergies:  Pt. Did not obtain Nasonex as recommended.  Explained MAP program again.  Also explained she needs to go back monthly again to pick up meds and that she has a year's worth of refills.

## 2015-12-25 NOTE — Patient Instructions (Signed)
Habla clinica si no eschucha para Dent[facility] en 1 mes

## 2016-05-12 ENCOUNTER — Ambulatory Visit: Payer: No Typology Code available for payment source | Admitting: Internal Medicine

## 2016-06-06 ENCOUNTER — Ambulatory Visit (INDEPENDENT_AMBULATORY_CARE_PROVIDER_SITE_OTHER): Payer: Self-pay | Admitting: Internal Medicine

## 2016-06-06 ENCOUNTER — Encounter: Payer: Self-pay | Admitting: Internal Medicine

## 2016-06-06 VITALS — BP 110/72 | HR 72 | Temp 97.9°F | Resp 16 | Ht <= 58 in | Wt 137.0 lb

## 2016-06-06 DIAGNOSIS — K029 Dental caries, unspecified: Secondary | ICD-10-CM

## 2016-06-06 DIAGNOSIS — J302 Other seasonal allergic rhinitis: Secondary | ICD-10-CM

## 2016-06-06 DIAGNOSIS — M62838 Other muscle spasm: Secondary | ICD-10-CM

## 2016-06-06 MED ORDER — DICLOFENAC SODIUM 75 MG PO TBEC: 75.0000 mg | DELAYED_RELEASE_TABLET | Freq: Two times a day (BID) | ORAL | 0 refills | Status: DC

## 2016-06-06 MED ORDER — CYCLOBENZAPRINE HCL 5 MG PO TABS: ORAL_TABLET | ORAL | 0 refills | Status: DC

## 2016-06-06 NOTE — Progress Notes (Signed)
   Subjective:    Patient ID: Nancy Howard, female    DOB: 18-Feb-1976, 40 y.o.   MRN: [id number]  HPI   1.  Right eye pain:  For 2 weeks.  Had red eye until Thursday of last week.  Itched when was red.  Had some pain as well. No discharge.  No watering.  No throat itching.  Denies nasal symptoms, but is sniffling and nasal with speech today.  No sneezing.  Not taking Cetirizine currently.  Is using the Nasonex.    2.  Right shoulder pain:  Pain from neck to elbow.  Complained of this back in December.  Was treated with gentle ROM/stretching exercises and Diclofenac/Flexeril combination that helped.  Similar pain today.  Is not continuing the exercises.  Does have a shoulder bag, uses over left shoulder.  Holds phone with left shoulder at times as well.  3.  Dental concerns:  Did go to dental clinic and sounds like had one filling placed.  Has another appt. In September  Current Meds  Medication Sig  . mometasone (NASONEX) 50 MCG/ACT nasal spray 2 sprays each nostril once daily   No Known Allergies     Review of Systems     Objective:   Physical Exam NAD Sniffling and congested sounding HEENT:  PERRL, EOMI, no conjunctival injection today, cobbling of posterior pharynx, nasal mucosa swollen and boggy, TMs pearly gray,  Dental decay Neck:  Supple, no adenopathy Chest:  CTA CV:  RRR without murmur or rub, radial pulses normal and equal Right trapezius tender--reproduces pain       Assessment & Plan:  1.  Seasonal Allergies:  To get restarted on Cetirizine and continue Nasonex regularly.  To call if does not improve.  Close windows and use air conditioning.  2. Right trapezius muscle pain/spasm:  ROM exercises.  Diclofenac 75 mg twice daily with food as needed.  Cyclobenzaprine 5 mg at bedtime as needed.  3.  Dental Caries:  To follow up with dental clinic.

## 2016-07-26 ENCOUNTER — Encounter: Payer: Self-pay | Admitting: Internal Medicine

## 2016-09-16 ENCOUNTER — Other Ambulatory Visit (INDEPENDENT_AMBULATORY_CARE_PROVIDER_SITE_OTHER): Payer: Self-pay

## 2016-09-16 DIAGNOSIS — Z23 Encounter for immunization: Secondary | ICD-10-CM

## 2016-11-10 ENCOUNTER — Encounter: Payer: Self-pay | Admitting: Internal Medicine

## 2016-11-10 ENCOUNTER — Ambulatory Visit (INDEPENDENT_AMBULATORY_CARE_PROVIDER_SITE_OTHER): Payer: Self-pay | Admitting: Internal Medicine

## 2016-11-10 VITALS — BP 120/80 | HR 78 | Resp 12 | Ht <= 58 in | Wt 141.0 lb

## 2016-11-10 DIAGNOSIS — J3089 Other allergic rhinitis: Secondary | ICD-10-CM

## 2016-11-10 DIAGNOSIS — H6982 Other specified disorders of Eustachian tube, left ear: Secondary | ICD-10-CM

## 2016-11-10 NOTE — Patient Instructions (Signed)
Ibuprofen 200 mg 2-4 pastillas con comida cada 6 horas a necesita dolor. Habla clinica si no mejor en 2 semanas o mas mal

## 2016-11-10 NOTE — Progress Notes (Signed)
   Subjective:    Patient ID: Nancy Howard, female    DOB: 1976-01-20, 41 y.o.   MRN: [id number]  HPI   Has had left ear pain for since Sunday.  Also felt feverish then, but did not take temp.   No history of water in her ear.  No definite cold symptoms.  Maybe a little sore throat. Does not feel she is having allergy symptoms.  Those tend to be in the the spring.  No sneezing or nasal congestion.  No itchy watery eyes or nose. Has had some yellow discharge from the ear at night.  Has had ear popping and decreased hearing from time to time.  No outpatient prescriptions have been marked as taking for the 11/10/16 encounter (Office Visit) with Mack Hook, MD.    No Known Allergies    Review of Systems     Objective:   Physical Exam  HEENT:  PERRL, EOMI, Retracted left TM with pinkening, right TM pearly gray, throat with mild injection, nasal mucosa with swelling Neck:  Supple, No adenopathy Chest:  CTA CV:  RRR without murmur or rub      Assessment & Plan:  Left eustachian tube dysfunction with URI vs allergy symptoms.  Restart allergy meds:  Zyrtec and Nasonex.  To call if no better in next 2 weeks.

## 2016-11-30 ENCOUNTER — Encounter: Payer: Self-pay | Admitting: Internal Medicine

## 2016-11-30 ENCOUNTER — Ambulatory Visit (INDEPENDENT_AMBULATORY_CARE_PROVIDER_SITE_OTHER): Payer: Self-pay | Admitting: Internal Medicine

## 2016-11-30 VITALS — BP 122/84 | HR 74 | Temp 97.5°F | Resp 12 | Ht <= 58 in | Wt 141.0 lb

## 2016-11-30 DIAGNOSIS — J302 Other seasonal allergic rhinitis: Secondary | ICD-10-CM

## 2016-11-30 DIAGNOSIS — H6982 Other specified disorders of Eustachian tube, left ear: Secondary | ICD-10-CM

## 2016-11-30 DIAGNOSIS — J3089 Other allergic rhinitis: Secondary | ICD-10-CM

## 2016-11-30 MED ORDER — MOMETASONE FUROATE 50 MCG/ACT NA SUSP: NASAL | 11 refills | Status: DC

## 2016-11-30 NOTE — Patient Instructions (Signed)
Flonase or generico:  Fluticasone 2 sprays cada nasa una vez al dia--cuando tiene Nasanex a 1100, no necesita Fluticasone (similar medicamento)  Allegra o generico:  Fexofenadine 180 mg, una pastilla una vez al dia

## 2016-11-30 NOTE — Progress Notes (Signed)
   Subjective:    Patient ID: Nancy Howard, female    DOB: [birth year], 41 y.o.   MRN: [id number]  HPI   Here to follow up for eustachian tube dysfunction.  Did not start Nasonex as it sounds like she never went through the application process for MAP at [facility].   Went over the application process again for MAP and she was given handout with the instructions. Did get Zyrtec and started taking about 2 weeks ago.  Makes her sleepy--makes her eyes heavy. Left ear pain with improvement, but comes back when takes a shower.  Current Meds  Medication Sig  . cetirizine (ZYRTEC) 10 MG tablet Take 1 tablet (10 mg total) by mouth daily.   No Known Allergies    Review of Systems     Objective:   Physical Exam   HEENT:  PERRL, EOMI, Left TM is very clear, but a bit retracted still.  No erythema.  Right TM normal as well., mild cobbling of posterior pharynx. Neck:  Supple, no adenopathy         Assessment & Plan:  Allergies with left eustachian tube dysfunction:  Actually sounds better, but not tolerating Zyrtec well with sedating. Switch to otc Fexofenadine 180 mg daily and Fluticasone nasal spray, 2 sprays once daily until can get Nasonex through MAP.   Instructions given once again.

## 2016-12-13 ENCOUNTER — Telehealth: Payer: Self-pay | Admitting: Internal Medicine

## 2016-12-13 ENCOUNTER — Other Ambulatory Visit: Payer: Self-pay

## 2016-12-13 DIAGNOSIS — J3089 Other allergic rhinitis: Secondary | ICD-10-CM

## 2016-12-13 MED ORDER — MOMETASONE FUROATE 50 MCG/ACT NA SUSP: NASAL | 11 refills | Status: DC

## 2016-12-13 NOTE — Telephone Encounter (Signed)
Patient did not qualify for MAP program and would like Rx called into [place].    Rx is mometasone 50 MGC/ACT nasal spray.

## 2016-12-13 NOTE — Telephone Encounter (Signed)
Please notify patient Rx was sent to [facility]

## 2016-12-14 NOTE — Telephone Encounter (Signed)
Patient was notified on 01/11/17.

## 2017-09-22 ENCOUNTER — Ambulatory Visit: Payer: Self-pay | Admitting: Internal Medicine

## 2017-09-22 ENCOUNTER — Encounter: Payer: Self-pay | Admitting: Internal Medicine

## 2017-09-22 VITALS — BP 122/78 | HR 78 | Resp 12 | Ht <= 58 in | Wt 141.0 lb

## 2017-09-22 DIAGNOSIS — D1722 Benign lipomatous neoplasm of skin and subcutaneous tissue of left arm: Secondary | ICD-10-CM

## 2017-09-22 DIAGNOSIS — Z23 Encounter for immunization: Secondary | ICD-10-CM

## 2017-09-22 DIAGNOSIS — D1720 Benign lipomatous neoplasm of skin and subcutaneous tissue of unspecified limb: Secondary | ICD-10-CM

## 2017-09-22 NOTE — Progress Notes (Signed)
   Subjective:    Patient ID: Nancy Howard, female    DOB: 04/19/1976, 41 y.o.   MRN: [id number]  HPI   Interpretation by N. Bello  Lump on top of left shoulder for 1 month.  Hurts to palpate.  Abduction and internal rotation causes a bit of pain, flexion minimally so.  External rotation does not seem to cause a problem for her. No pain with removing her shirts or tops.  No memory of injuring the area.   Has not taken any medication for the area. The bump has not changed in size from 1 month ago.   No outpatient medications have been marked as taking for the 09/22/17 encounter (Office Visit) with Mack Hook, MD.    No Known Allergies   Review of Systems     Objective:   Physical Exam NAD Full ROM of left shoulder.  3 cm subcutaneous soft tissue lesion overlying AC joint area.  Moveable.  Lesion was minimally tender to palpation.  No erythema or fluctuance       Assessment & Plan:  1.  Likely lipoma overlying left shoulder in [facility] joint area.   Watchful waiting. To call if begins enlarging, causing pain or new symptoms.  2.  HM: influenza vaccine.

## 2017-09-22 NOTE — Patient Instructions (Signed)
Call for increased pain or enlargement of left shoulder bump

## 2017-09-27 ENCOUNTER — Encounter: Payer: Self-pay | Admitting: Internal Medicine

## 2017-09-27 DIAGNOSIS — D1720 Benign lipomatous neoplasm of skin and subcutaneous tissue of unspecified limb: Secondary | ICD-10-CM

## 2017-09-27 HISTORY — DX: Benign lipomatous neoplasm of skin and subcutaneous tissue of unspecified limb: D17.20

## 2017-12-22 NOTE — Progress Notes (Signed)
error 

## 2017-12-25 ENCOUNTER — Ambulatory Visit: Payer: Self-pay | Admitting: Internal Medicine

## 2018-03-20 ENCOUNTER — Ambulatory Visit: Payer: Self-pay | Admitting: Internal Medicine

## 2018-03-20 ENCOUNTER — Encounter: Payer: Self-pay | Admitting: Internal Medicine

## 2018-03-20 VITALS — BP 122/82 | HR 68 | Temp 98.0°F | Resp 12 | Ht <= 58 in | Wt 140.0 lb

## 2018-03-20 DIAGNOSIS — Z9109 Other allergy status, other than to drugs and biological substances: Secondary | ICD-10-CM

## 2018-03-20 DIAGNOSIS — J3089 Other allergic rhinitis: Secondary | ICD-10-CM

## 2018-03-20 DIAGNOSIS — D1720 Benign lipomatous neoplasm of skin and subcutaneous tissue of unspecified limb: Secondary | ICD-10-CM

## 2018-03-20 MED ORDER — MOMETASONE FUROATE 50 MCG/ACT NA SUSP: NASAL | 11 refills | Status: DC

## 2018-03-20 MED ORDER — CETIRIZINE HCL 10 MG PO TABS: 10.0000 mg | ORAL_TABLET | Freq: Every day | ORAL | 11 refills | Status: DC

## 2018-03-20 NOTE — Progress Notes (Signed)
   Subjective:    Patient ID: Nancy Howard, female    DOB: [birth year], 42 y.o.   MRN: [id number]  HPI  . 1Here for right sore throat with decreased hearing for 2 weeks.   No fevers.  No nasal discharge, but is nasally congested with sneezing and itchy nose, eyes sometimes itchy.  Throat without itching.  + posterior pharyngeal drainage.  Drainage with a bad taste.  Is not coughing it up.  No sinus pressure or pain. Does have worsening allergies this time of year as well as year round .  Has been using Fluticasone nasal spray 2 sprays each nostril daily.  Has been using daily for 6 months. No allergy pills, however.     2.  Bump on left shoulder for 1 year.  Has grown a bit.    Current Meds  Medication Sig  . fluticasone (FLONASE) 50 MCG/ACT nasal spray Place 2 sprays into both nostrils daily.     No Known Allergies  Review of Systems     Objective:   Physical Exam NAD HEENT:  PERRL, EOMI, conjunctivae without injection.  TMs retracted and dull, but without injection.  Nasal mucosa boggy with clear discharge. NT over frontal and maxillary sinuses Throat without injection, no exudate. Neck:  Supple, No adenopathy Chest:  CTA CV:  RRR without murmur or rub. Left shoulder:  Well circumscribed soft tissue mass about 4 cm x 3 cm overlying AC joint area.  Minimal tenderness.          Assessment & Plan:  1.  Environmental allergies with seasonal exacerbation:  To add Cetirizine.  Renewed Rx at [facility].  Encouraged to start oral medication in future when pollen season starts. Continue Fluticasone nasal spray Encouraged applying to MAP for Nasonex and discontinuing Fluticasone thereafter.  2.  Lipoma, Left shoulder:  See previous evaluation about 6 months ago.  Minimal to no change.  Follow.

## 2018-06-21 ENCOUNTER — Encounter: Payer: Self-pay | Admitting: Internal Medicine

## 2018-11-14 ENCOUNTER — Encounter: Payer: Self-pay | Admitting: Internal Medicine

## 2018-11-14 ENCOUNTER — Ambulatory Visit: Payer: Self-pay | Admitting: Internal Medicine

## 2018-11-14 VITALS — BP 122/84 | HR 62 | Resp 12 | Ht <= 58 in | Wt 139.0 lb

## 2018-11-14 DIAGNOSIS — Z1239 Encounter for other screening for malignant neoplasm of breast: Secondary | ICD-10-CM

## 2018-11-14 DIAGNOSIS — Z0000 Encounter for general adult medical examination without abnormal findings: Secondary | ICD-10-CM

## 2018-11-14 DIAGNOSIS — E785 Hyperlipidemia, unspecified: Secondary | ICD-10-CM

## 2018-11-14 DIAGNOSIS — Z124 Encounter for screening for malignant neoplasm of cervix: Secondary | ICD-10-CM

## 2018-11-14 DIAGNOSIS — E669 Obesity, unspecified: Secondary | ICD-10-CM

## 2018-11-14 DIAGNOSIS — Z9109 Other allergy status, other than to drugs and biological substances: Secondary | ICD-10-CM

## 2018-11-14 DIAGNOSIS — D1722 Benign lipomatous neoplasm of skin and subcutaneous tissue of left arm: Secondary | ICD-10-CM

## 2018-11-14 DIAGNOSIS — E66811 Obesity, class 1: Secondary | ICD-10-CM

## 2018-11-14 NOTE — Patient Instructions (Signed)

## 2018-11-14 NOTE — Progress Notes (Signed)
Subjective:    Patient ID: Nancy Howard, female   DOB: June 17, 1976, 43 y.o.   MRN: [id number]   HPI   CPE with pap  1.  Pap:  Last pap 10/2015 and normal.  Always normal.  No family history of cervical cancer.    2.  Mammogram:  Never.  No family history of breast cancer.    3.  Osteoprevention: Eats or drinks 3 servings of milk, yogurt and cheese daily.  States she is very physically active with her daily life.    4.  Guaiac Cards:  Never.  5.  Colonoscopy:  Never.  No family history of colon cancer.  6.  Immunizations:  Did not obtain free influenza vaccine with one of our free clinics. Immunization History  Administered Date(s) Administered  . Influenza Inj Mdck Quad Pf 09/16/2016, 09/22/2017  . Influenza,inj,Quad PF,6+ Mos 12/25/2015  . Tdap 03/26/2008    7.  Glucose/Cholesterol:  Normal fasting glucose and low HDL with rest of lipid panel at goal in past.  Lipid Panel     Component Value Date/Time   CHOL 142 11/12/2015 1212   TRIG 145 11/12/2015 1212   HDL 37 (L) 11/12/2015 1212   CHOLHDL 3.6 Ratio 02/06/2009 2136   VLDL 30 02/06/2009 2136   LDLCALC 76 11/12/2015 1212    No outpatient medications have been marked as taking for the 11/14/18 encounter (Office Visit) with Mack Hook, MD.   No Known Allergies   Past Medical History:  Diagnosis Date  . Allergy    Summer  . Asymptomatic cholelithiasis 2005   Ultrasound diagnosis  . Lipoma of shoulder 09/27/2017  . Obesity (BMI 30.0-34.9)     Past Surgical History:  Procedure Laterality Date  . CESAREAN SECTION  06/10/1998, 01/29/2003   06/28/2004  . TUBAL LIGATION  2011   No family history on file.  Social History   Socioeconomic History  . Marital status: Married    Spouse name: Frederico  . Number of children: 3  . Years of education: 9  . Highest education level: 9th grade  Occupational History  . Occupation: Agricultural engineer  Social Needs  . Financial resource strain: Not on file  .  Food insecurity:    Worry: Never true    Inability: Never true  . Transportation needs:    Medical: No    Non-medical: No  Tobacco Use  . Smoking status: Never Smoker  . Smokeless tobacco: Never Used  Substance and Sexual Activity  . Alcohol use: No    Alcohol/week: 0.0 standard drinks  . Drug use: No  . Sexual activity: Yes    Partners: Male    Birth control/protection: Surgical    Comment: Tubial Ligation  Lifestyle  . Physical activity:    Days per week: 7 days    Minutes per session: 30 min  . Stress: Not on file  Relationships  . Social connections:    Talks on phone: More than three times a week    Gets together: More than three times a week    Attends religious service: More than 4 times per year    Active member of club or organization: No    Attends meetings of clubs or organizations: Never    Relationship status: Not on file  . Intimate partner violence:    Fear of current or ex partner: No    Emotionally abused: No    Physically abused: No    Forced sexual activity: No  Other  Topics Concern  . Not on file  Social History Narrative   Came to [place] In [place]   Lives at home with husband and 3 children   2 young adult nieces live with them, ages 57 and 20 yo.      Review of Systems  Constitutional: Negative for appetite change, fatigue and fever.  HENT: Negative for dental problem (Has had dental visit in last year.  Does not have current orange card.), ear pain, hearing loss, sneezing and sore throat.   Eyes: Negative for visual disturbance.  Respiratory: Negative for cough and shortness of breath.   Cardiovascular: Negative for chest pain, palpitations and leg swelling.  Gastrointestinal: Negative for abdominal pain, blood in stool (No melena.), constipation and diarrhea.  Genitourinary: Negative for dysuria, frequency and menstrual problem.  Musculoskeletal: Negative for arthralgias.  Skin: Negative for rash.  Neurological: Negative for weakness and  numbness.  Psychiatric/Behavioral: Negative for dysphoric mood. The patient is not nervous/anxious.       Objective:   BP 122/84 (BP Location: Left Arm, Patient Position: Sitting, Cuff Size: Normal)   Pulse 62   Resp 12   Ht 4\' 9"  (1.448 m)   Wt 139 lb (63 kg)   LMP 11/01/2018   BMI 30.08 kg/m   Physical Exam  Constitutional: She is oriented to person, place, and time. She appears well-developed and well-nourished.  HENT:  Head: Normocephalic and atraumatic.  Right Ear: Hearing, tympanic membrane, external ear and ear canal normal.  Left Ear: Hearing, tympanic membrane, external ear and ear canal normal.  Nose: Nose normal.  Mouth/Throat: Uvula is midline and oropharynx is clear and moist.  Eyes: Pupils are equal, round, and reactive to light. Conjunctivae and EOM are normal.  Discs sharp bilaterally  Neck: Normal range of motion and full passive range of motion without pain. Neck supple. No thyroid mass and no thyromegaly present.  Cardiovascular: Normal rate, regular rhythm, S1 normal and S2 normal. Exam reveals no S3, no S4 and no friction rub.  No murmur heard. No carotid bruits.  Carotid, radial, femoral, DP and PT pulses normal and equal.   Pulmonary/Chest: Effort normal and breath sounds normal. Right breast exhibits no inverted nipple, no mass, no nipple discharge, no skin change and no tenderness. Left breast exhibits no inverted nipple, no mass, no nipple discharge, no skin change and no tenderness.  Abdominal: Soft. Bowel sounds are normal. She exhibits no mass. There is no hepatosplenomegaly. There is no abdominal tenderness. No hernia.  Genitourinary:    Rectum normal.  Rectum:     Guaiac result negative.     No rectal mass.     Genitourinary Comments: Normal external female genitalia No vaginal discharge.  No vaginal or cervical lesions. No uterine or adnexal mass or tenderness.    Musculoskeletal: Normal range of motion.     Comments: Soft fatty subcutaneous  mass, LUE  Lymphadenopathy:       Head (right side): No submental and no submandibular adenopathy present.       Head (left side): No submental and no submandibular adenopathy present.    She has no cervical adenopathy.    She has no axillary adenopathy.       Right: No inguinal and no supraclavicular adenopathy present.       Left: No inguinal and no supraclavicular adenopathy present.  Neurological: She is alert and oriented to person, place, and time. She has normal strength and normal reflexes. No cranial nerve deficit or sensory  deficit. Coordination normal.  Skin: Skin is warm. No rash noted.  Psychiatric: She has a normal mood and affect. Her speech is normal and behavior is normal. Judgment and thought content normal. Cognition and memory are normal.     Assessment & Plan   1.  CPE with pap. Mammogram scheduled. Guaiac Cards x3 to return in 2 weeks CBC, CMP, FLP Encourage year influenza vaccine in the fall.  2.  Obesity and dyslipidemia:  Encouraged making weekly small goals for lifestyle changes.  3.  Lipoma, LUE:  Follow.

## 2018-11-15 LAB — COMPREHENSIVE METABOLIC PANEL
A/G RATIO: 1.9 (ref 1.2–2.2)
ALK PHOS: 66 IU/L (ref 39–117)
ALT: 16 IU/L (ref 0–32)
AST: 13 IU/L (ref 0–40)
Albumin: 4.7 g/dL (ref 3.8–4.8)
BILIRUBIN TOTAL: 0.5 mg/dL (ref 0.0–1.2)
BUN/Creatinine Ratio: 13 (ref 9–23)
BUN: 8 mg/dL (ref 6–24)
CHLORIDE: 104 mmol/L (ref 96–106)
CO2: 22 mmol/L (ref 20–29)
Calcium: 9.2 mg/dL (ref 8.7–10.2)
Creatinine, Ser: 0.63 mg/dL (ref 0.57–1.00)
GFR calc non Af Amer: 112 mL/min/{1.73_m2} (ref >59)
GFR, EST AFRICAN AMERICAN: 129 mL/min/{1.73_m2} (ref >59)
Globulin, Total: 2.5 g/dL (ref 1.5–4.5)
Glucose: 84 mg/dL (ref 65–99)
POTASSIUM: 4.2 mmol/L (ref 3.5–5.2)
Sodium: 140 mmol/L (ref 134–144)
TOTAL PROTEIN: 7.2 g/dL (ref 6.0–8.5)

## 2018-11-15 LAB — CBC WITH DIFFERENTIAL/PLATELET
Basophils Absolute: 0 10*3/uL (ref 0.0–0.2)
Basos: 1 %
EOS (ABSOLUTE): 0.2 10*3/uL (ref 0.0–0.4)
Eos: 4 %
Hematocrit: 34 % (ref 34.0–46.6)
Hemoglobin: 10.6 g/dL — ABNORMAL LOW (ref 11.1–15.9)
IMMATURE GRANULOCYTES: 0 %
Immature Grans (Abs): 0 10*3/uL (ref 0.0–0.1)
LYMPHS ABS: 2.3 10*3/uL (ref 0.7–3.1)
LYMPHS: 39 %
MCH: 18.9 pg — ABNORMAL LOW (ref 26.6–33.0)
MCHC: 31.2 g/dL — ABNORMAL LOW (ref 31.5–35.7)
MCV: 61 fL — ABNORMAL LOW (ref 79–97)
Monocytes Absolute: 0.5 10*3/uL (ref 0.1–0.9)
Monocytes: 9 %
NEUTROS ABS: 2.9 10*3/uL (ref 1.4–7.0)
Neutrophils: 47 %
PLATELETS: 288 10*3/uL (ref 150–450)
RBC: 5.61 x10E6/uL — AB (ref 3.77–5.28)
RDW: 17.3 % — AB (ref 11.7–15.4)
WBC: 6 10*3/uL (ref 3.4–10.8)

## 2018-11-15 LAB — LIPID PANEL W/O CHOL/HDL RATIO
CHOLESTEROL TOTAL: 146 mg/dL (ref 100–199)
HDL: 40 mg/dL (ref >39)
LDL Calculated: 75 mg/dL (ref 0–99)
TRIGLYCERIDES: 157 mg/dL — AB (ref 0–149)
VLDL Cholesterol Cal: 31 mg/dL (ref 5–40)

## 2018-11-22 LAB — CYTOLOGY - PAP

## 2019-01-01 ENCOUNTER — Other Ambulatory Visit: Payer: Self-pay

## 2019-01-16 ENCOUNTER — Other Ambulatory Visit: Payer: Self-pay

## 2019-02-18 ENCOUNTER — Other Ambulatory Visit: Payer: Self-pay

## 2021-04-28 ENCOUNTER — Ambulatory Visit: Payer: Self-pay | Admitting: Internal Medicine

## 2021-04-28 ENCOUNTER — Encounter: Payer: Self-pay | Admitting: Internal Medicine

## 2021-04-28 ENCOUNTER — Other Ambulatory Visit: Payer: Self-pay

## 2021-04-28 VITALS — BP 128/114 | HR 72 | Resp 16 | Ht <= 58 in | Wt 147.0 lb

## 2021-04-28 DIAGNOSIS — Z23 Encounter for immunization: Secondary | ICD-10-CM

## 2021-04-28 DIAGNOSIS — D649 Anemia, unspecified: Secondary | ICD-10-CM

## 2021-04-28 DIAGNOSIS — Z1231 Encounter for screening mammogram for malignant neoplasm of breast: Secondary | ICD-10-CM

## 2021-04-28 DIAGNOSIS — Z0000 Encounter for general adult medical examination without abnormal findings: Secondary | ICD-10-CM

## 2021-04-28 DIAGNOSIS — D1720 Benign lipomatous neoplasm of skin and subcutaneous tissue of unspecified limb: Secondary | ICD-10-CM

## 2021-04-28 NOTE — Progress Notes (Unsigned)
Subjective:    Patient ID: Nancy Howard, female   DOB: 1975/10/26, 45 y.o.   MRN: [id number]   HPI  CPE without pap  1.  Pap:  Last pap normal 11/14/2018.  Noted to have BV only then.  Not due today.    2.  Mammogram:  Never.  No family history of breast cancer.  3.  Osteoprevention:  Drinks 2% Lactaid or almond milk once daily.  Willing to increase to 3-4 servings daily.  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  3 days weekly.  Very physically active with her housework.    4.  Guaiac Cards:  Never  5.  Colonoscopy:  Never.  No family history of colon cancer    6.  Immunizations:  Has not obtained any COVID vaccinations.  She is scared of them.  States a Hydrologist near [place] told her not to get the vaccine.  7.  Glucose/Cholesterol:  No history of hyperglycemia.  Good total cholesterol with dyslipidemia last checked in 2020. Lipid Panel     Component Value Date/Time   CHOL 146 11/14/2018 1141   TRIG 157 (H) 11/14/2018 1141   HDL 40 11/14/2018 1141   CHOLHDL 3.6 Ratio 02/06/2009 2136   VLDL 30 02/06/2009 2136   LDLCALC 75 11/14/2018 1141   LABVLDL 31 11/14/2018 1141   History of chronic microcytic anemia.  Current Meds  Medication Sig   fluticasone (FLONASE) 50 MCG/ACT nasal spray Place 2 sprays into both nostrils daily.   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   Microcytic anemia    Obesity (BMI 30.0-34.9)     Past Surgical History:  Procedure Laterality Date   CESAREAN SECTION  06/10/1998, 01/29/2003   06/28/2004   TUBAL LIGATION  2011   Family History  Problem Relation Age of Onset   Anemia Daughter     Family Status  Relation Name Status   Mother  Alive, age 68y   Father  Alive, age 15y   Sister  Alive   Sister  [place]   Daughter  Alive, age 65y   Daughter  25, age 8y   Son  Alive, age 50y    Social History   Socioeconomic History   Marital status: Married    Spouse name: Frederico   Number of children: 3   Years of education: 9   Highest education level: 9th grade  Occupational History   Occupation: Agricultural engineer  Tobacco Use   Smoking status: Never   Smokeless tobacco: Never  Vaping Use   Vaping Use: Never used  Substance and Sexual Activity   Alcohol use: Never   Drug use: No   Sexual activity: Yes    Partners: Male    Birth control/protection: Surgical    Comment: Tubial Ligation  Other Topics Concern   Not on file  Social History Narrative   Came to [place] In [place]   Lives at home with husband and 3 children   Husband is a Estate manager/land agent and she helps him with his business at times.   Social Determinants of Health   Financial Resource Strain: Low Risk    Difficulty of Paying Living Expenses: Not hard at all  Food Insecurity: No  Food Insecurity   Worried About Charity fundraiser in the Last Year: Never true   Ran Out of Food in the Last Year: Never true  Transportation Needs: Not on file  Physical Activity: Not on file  Stress: Not on file  Social Connections: Not on file  Intimate Partner Violence: Not At Risk   Fear of Current or Ex-Partner: No   Emotionally Abused: No   Physically Abused: No   Sexually Abused: No     Review of Systems  Gastrointestinal:  Negative for blood in stool (No melena).  Genitourinary:  Negative for menstrual problem (Periods are getting farther apart, but no hot flashes or night sweats.).  Skin:        Lipoma on left shoulder still bothers her from time to time.  She is not certain if it has changed size.  Hurts more so at night.       Objective:   BP (!) 128/114 (BP Location: Left Arm, Patient Position: Sitting, Cuff Size: Normal)   Pulse 72   Resp 16   Ht 4' 9.75" (1.467 m)   Wt 147 lb (66.7 kg)    LMP 01/15/2021 (Approximate) Comment: Has period every 4 months or so  BMI 30.99 kg/m   Physical Exam Lots of anxiety with pelvic exam--could not reach cervix, but exam normal.  Assessment & Plan   CPE without pap Schedule mammogram Fasting labs when in for 2nd [facility] in 3 weeks.  To wait to perform stool testing for blood a week before coming in.   2.  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  Hgb electrophoresis and iron studies

## 2021-05-16 ENCOUNTER — Emergency Department (HOSPITAL_COMMUNITY)
Admission: EM | Admit: 2021-05-16 | Discharge: 2021-05-17 | Disposition: A | Discharge status: Home / Self Care | Payer: Self-pay | Attending: Emergency Medicine | Admitting: Emergency Medicine

## 2021-05-16 ENCOUNTER — Other Ambulatory Visit: Payer: Self-pay

## 2021-05-16 DIAGNOSIS — R0602 Shortness of breath: Secondary | ICD-10-CM

## 2021-05-16 DIAGNOSIS — R55 Syncope and collapse: Secondary | ICD-10-CM

## 2021-05-16 DIAGNOSIS — Y900 Blood alcohol level of less than 20 mg/100 ml: Secondary | ICD-10-CM | POA: Insufficient documentation

## 2021-05-16 DIAGNOSIS — I10 Essential (primary) hypertension: Secondary | ICD-10-CM | POA: Insufficient documentation

## 2021-05-16 DIAGNOSIS — Z79899 Other long term (current) drug therapy: Secondary | ICD-10-CM | POA: Insufficient documentation

## 2021-05-16 MED ORDER — NALOXONE HCL 4 MG/0.1ML NA LIQD: 1.0000 | Freq: Once | NASAL | Status: DC

## 2021-05-16 NOTE — ED Provider Notes (Signed)
Emergency Medicine Provider Triage Evaluation Note  Nancy Howard , a 45 y.o. female  was evaluated in triage.  Pt complains of SOB.  **Patient unresponsive when I assessed her in triage.  She was breathing and had a pulse.  May have passed out.  No seizure activity witnessed.  Aroused after painful stimulus.  Moved to acute due to acuity of condition  Review of Systems  Positive: Sob, ALOC Negative: Unable to assess  Physical Exam  BP (!) 189/126 (BP Location: Right Arm)   Pulse 84   Temp 98.3 F (36.8 C)   Resp 20   SpO2 100%  Gen:   Unresponsive Resp:  Normal effort  MSK:   Moves extremities without difficulty  Other:    Medical Decision Making  Medically screening exam initiated at 11:48 PM.  Appropriate orders placed.  Mylinda Haskett was informed that the remainder of the evaluation will be completed by another provider, this initial triage assessment does not replace that evaluation, and the importance of remaining in the ED until their evaluation is complete.  Move to back.  Notified charge RN and attendings due to unresponsiveness.  Narcan ordered.  Labs and initial workup ordered.  Patient's only complaint was SOB was she woke up some.   Montine Circle, PA-C 05/16/21 2352    Orpah Greek, MD 05/17/21 [phone]

## 2021-05-16 NOTE — ED Triage Notes (Signed)
Pt states she has "lost her breath"  Upon entering triage room pt was unresponsive and responded to sternal rub.  Pt continued to have a hard time holding herself up in the chair but was able to answer questions with prompting.

## 2021-05-17 ENCOUNTER — Emergency Department (HOSPITAL_COMMUNITY): Payer: Self-pay

## 2021-05-17 LAB — I-STAT BETA HCG BLOOD, ED (MC, WL, AP ONLY): I-stat hCG, quantitative: <5 m[IU]/mL (ref <5)

## 2021-05-17 LAB — CBC WITH DIFFERENTIAL/PLATELET
Abs Immature Granulocytes: 0.06 10*3/uL (ref 0.00–0.07)
Basophils Absolute: 0.1 10*3/uL (ref 0.0–0.1)
Basophils Relative: 1 %
Eosinophils Absolute: 0.3 10*3/uL (ref 0.0–0.5)
Eosinophils Relative: 3 %
HCT: 39.1 % (ref 36.0–46.0)
Hemoglobin: 12 g/dL (ref 12.0–15.0)
Immature Granulocytes: 1 %
Lymphocytes Relative: 47 %
Lymphs Abs: 4.9 10*3/uL — ABNORMAL HIGH (ref 0.7–4.0)
MCH: 18.5 pg — ABNORMAL LOW (ref 26.0–34.0)
MCHC: 30.7 g/dL (ref 30.0–36.0)
MCV: 60.4 fL — ABNORMAL LOW (ref 80.0–100.0)
Monocytes Absolute: 0.7 10*3/uL (ref 0.1–1.0)
Monocytes Relative: 7 %
Neutro Abs: 4.1 10*3/uL (ref 1.7–7.7)
Neutrophils Relative %: 41 %
Platelets: 302 10*3/uL (ref 150–400)
RBC: 6.47 MIL/uL — ABNORMAL HIGH (ref 3.87–5.11)
RDW: 17.4 % — ABNORMAL HIGH (ref 11.5–15.5)
WBC: 10.1 10*3/uL (ref 4.0–10.5)
nRBC: 0 % (ref 0.0–0.2)

## 2021-05-17 LAB — COMPREHENSIVE METABOLIC PANEL
ALT: 31 U/L (ref 0–44)
AST: 22 U/L (ref 15–41)
Albumin: 4.6 g/dL (ref 3.5–5.0)
Alkaline Phosphatase: 86 U/L (ref 38–126)
Anion gap: 10 (ref 5–15)
BUN: 16 mg/dL (ref 6–20)
CO2: 20 mmol/L — ABNORMAL LOW (ref 22–32)
Calcium: 10.2 mg/dL (ref 8.9–10.3)
Chloride: 108 mmol/L (ref 98–111)
Creatinine, Ser: 0.73 mg/dL (ref 0.44–1.00)
GFR, Estimated: >60 mL/min (ref >60)
Glucose, Bld: 130 mg/dL — ABNORMAL HIGH (ref 70–99)
Potassium: 3.1 mmol/L — ABNORMAL LOW (ref 3.5–5.1)
Sodium: 138 mmol/L (ref 135–145)
Total Bilirubin: 0.5 mg/dL (ref 0.3–1.2)
Total Protein: 7.9 g/dL (ref 6.5–8.1)

## 2021-05-17 LAB — D-DIMER, QUANTITATIVE: D-Dimer, Quant: <0.27 ug/mL-FEU (ref 0.00–0.50)

## 2021-05-17 LAB — TROPONIN I (HIGH SENSITIVITY)
Troponin I (High Sensitivity): 3 ng/L (ref <18)
Troponin I (High Sensitivity): 4 ng/L (ref <18)

## 2021-05-17 LAB — ETHANOL: Alcohol, Ethyl (B): <10 mg/dL (ref <10)

## 2021-05-17 LAB — RAPID URINE DRUG SCREEN, HOSP PERFORMED
Amphetamines: NOT DETECTED
Barbiturates: NOT DETECTED
Benzodiazepines: NOT DETECTED
Cocaine: NOT DETECTED
Opiates: NOT DETECTED
Tetrahydrocannabinol: NOT DETECTED

## 2021-05-17 LAB — SALICYLATE LEVEL: Salicylate Lvl: <7 mg/dL — ABNORMAL LOW (ref 7.0–30.0)

## 2021-05-17 LAB — ACETAMINOPHEN LEVEL: Acetaminophen (Tylenol), Serum: <10 ug/mL — ABNORMAL LOW (ref 10–30)

## 2021-05-17 LAB — CBG MONITORING, ED: Glucose-Capillary: 127 mg/dL — ABNORMAL HIGH (ref 70–99)

## 2021-05-17 MED ORDER — SODIUM CHLORIDE 0.9 % IV BOLUS
1000.0000 mL | Freq: Once | INTRAVENOUS | Status: DC
Start: 2021-05-17 — End: 2021-05-17

## 2021-05-17 MED ORDER — ALUM & MAG HYDROXIDE-SIMETH 200-200-20 MG/5ML PO SUSP
30.0000 mL | Freq: Once | ORAL | Status: AC
  Administered 2021-05-17: 30 mL via ORAL
  Filled 2021-05-17: qty 30

## 2021-05-17 MED ORDER — ONDANSETRON HCL 4 MG/2ML IJ SOLN
4.0000 mg | Freq: Once | INTRAMUSCULAR | Status: AC
  Administered 2021-05-17: 4 mg via INTRAVENOUS
  Filled 2021-05-17: qty 2

## 2021-05-17 MED ORDER — SODIUM CHLORIDE 0.9 % IV BOLUS
1000.0000 mL | Freq: Once | INTRAVENOUS | Status: AC
  Administered 2021-05-17: 1000 mL via INTRAVENOUS

## 2021-05-17 NOTE — Discharge Instructions (Addendum)
Orthostatic hypertension is a medical condition consisting of a sudden and abrupt increase in blood pressure (BP) when a person stands up. Orthostatic hypertension is diagnosed by a rise in systolic BP of 20 mmHg or more when standing. Orthostatic diastolic hypertension is a condition in which the diastolic BP raises to 98 mmHg or over in response to standing, but this definition currently lacks clear medical consensus, so is subject to change. Orthostatic hypertension involving the systolic BP is known as systolic orthostatic hypertension.  Please discuss this with your PCP during your upcoming visit.

## 2021-05-17 NOTE — ED Provider Notes (Signed)
[facility]GENC[facility] DEPART[facility] Provider Note  CSN: [id number] Arrival date & time: 05/16/21 2256  Chief Complaint(s) Shortness of Breath  HPI Nancy Howard is a 45 y.o. female with a past medical history listed below who presents to the emergency department for shortness of breath and near syncope.  Onset approximately 1 hour ago. Symptoms have been constant but fluctuating since that time. No alleviating or aggravating factors. No precipitating factors. She denies recent fevers or infections.  No coughing or congestion.  No nausea or vomiting.  No diarrhea.  No chest pain.  No abdominal pain. Last menstrual period was 4 to 5 months ago but patient reports that she is a regular.  Reports that she had another episode similar to this but not as severe several weeks ago, which she did not seek medical care for. She did report that she saw her PCP since that time. On review of records PCPs note from July 13 does not mention any near syncopal episodes or shortness of breath. She reports not mentioning to them.  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 Authorizing Provider  cetirizine (ZYRTEC) 10 MG tablet Take 1 tablet (10 mg total) by mouth daily. Patient not taking: Reported on 04/28/2021 03/20/18   Mack Hook, MD  fluticasone [facility]) 50 MCG/ACT nasal spray Place 2 sprays into both nostrils  daily.    [provider]                                                                                                                                    Past Surgical History Past Surgical History:  Procedure Laterality Date   CESAREAN SECTION  06/10/1998, 01/29/2003   06/28/2004   TUBAL LIGATION  2011   Family History Family History  Problem Relation Age of Onset   Anemia Daughter     Social History Social History   Tobacco Use   Smoking status: Never   Smokeless tobacco: Never  Vaping Use   Vaping Use: Never used  Substance Use Topics   Alcohol use: Never   Drug use: No   Allergies Patient has no known allergies.  Review of Systems Review of Systems All other systems are reviewed and are negative for acute change except as noted in the HPI  Physical Exam Vital Signs  I have reviewed the triage vital signs BP 128/90   Pulse (!) 57   Temp 98.3 F (36.8 C)   Resp 16   SpO2 100%   Physical Exam Vitals reviewed.  Constitutional:      General: She is not in acute distress.    Appearance: She is well-developed. She is not diaphoretic.  HENT:     Head: Normocephalic and atraumatic.     Nose: Nose normal.  Eyes:     General: No scleral icterus.       Right eye: No discharge.        Left eye: No discharge.     Conjunctiva/sclera: Conjunctivae normal.     Pupils: Pupils are equal, round, and reactive to light.  Cardiovascular:     Rate and Rhythm: Normal rate and regular rhythm.     Heart sounds: No murmur heard.   No friction rub. No gallop.  Pulmonary:     Effort: Pulmonary effort is normal. No respiratory distress.     Breath sounds: Normal breath sounds. No stridor. No rales.  Abdominal:     General: There is no distension.     Palpations: Abdomen is soft.     Tenderness: There is no abdominal tenderness.  Musculoskeletal:        General: No tenderness.     Cervical back: Normal range of motion and neck supple.  Skin:    General: Skin is  warm and dry.     Findings: No erythema or rash.  Neurological:     Mental Status: She is alert and oriented to person, place, and time.    ED Results and Treatments Labs (all labs ordered are listed, but only abnormal results are displayed) Labs Reviewed  CBC WITH DIFFERENTIAL/PLATELET - Abnormal; Notable for the following components:      Result Value   RBC 6.47 (*)    MCV 60.4 (*)    MCH 18.5 (*)    RDW 17.4 (*)    Lymphs Abs 4.9 (*)    All other components within normal limits  COMPREHENSIVE METABOLIC PANEL - Abnormal; Notable for the following components:   Potassium 3.1 (*)    CO2 20 (*)    Glucose, Bld 130 (*)    All other components within normal limits  SALICYLATE LEVEL - Abnormal; Notable for the following components:   Salicylate Lvl Q000111Q (*)    All other components within normal limits  ACETAMINOPHEN LEVEL - Abnormal; Notable for the following components:   Acetaminophen (Tylenol), Serum <10 (*)    All other components within normal limits  CBG MONITORING, ED - Abnormal; Notable for the following components:   Glucose-Capillary 127 (*)    All other components within normal limits  ETHANOL  D-DIMER, QUANTITATIVE  RAPID URINE DRUG SCREEN, HOSP PERFORMED  I-STAT BETA HCG BLOOD, ED ([facility], [facility], [facility] ONLY)  TROPONIN I (HIGH SENSITIVITY)  TROPONIN I (HIGH SENSITIVITY)  EKG  EKG Interpretation  Date/Time:  Sunday May 16 2021 23:41:59 EDT Ventricular Rate:  79 PR Interval:  140 QRS Duration: 86 QT Interval:  382 QTC Calculation: 438 R Axis:   -16 Text Interpretation: Sinus rhythm with occasional Premature ventricular complexes No old tracing to compare Confirmed by Addison Lank [phone]) on 05/17/2021 12:17:58 AM       Radiology DG Chest Portable 1 View  Result Date: 05/17/2021 CLINICAL DATA:  Shortness of breath. Upon entering triage room pt was  unresponsive and responded to sternal rub. Pt continued to have a hard time holding herself up in the chair but was able to answer questions with prompting. EXAM: PORTABLE CHEST 1 VIEW COMPARISON:  None. FINDINGS: The heart size and mediastinal contours are within normal limits. No focal consolidation. No pulmonary edema. No pleural effusion. No pneumothorax. No acute osseous abnormality. IMPRESSION: No active disease. Electronically Signed   By: Iven Finn M.D.   On: 05/17/2021 00:40    Pertinent labs & imaging results that were available during my care of the patient were reviewed by me and considered in my medical decision making (see chart for details).  Medications Ordered in ED Medications  sodium chloride 0.9 % bolus 1,000 mL (0 mLs Intravenous Stopped 05/17/21 0314)  alum & mag hydroxide-simeth (MAALOX/MYLANTA) 200-200-20 MG/5ML suspension 30 mL (30 mLs Oral Given 05/17/21 0519)  ondansetron (ZOFRAN) injection 4 mg (4 mg Intravenous Given 05/17/21 0519)                                                                                                                                    Procedures Procedures  (including critical care time)  Medical Decision Making / ED Course I have reviewed the nursing notes for this encounter and the patient's prior records (if available in EHR or on provided paperwork).   Nancy Howard was evaluated in Emergency Department on 05/17/2021 for the symptoms described in the history of present illness. She was evaluated in the context of the global COVID-19 pandemic, which necessitated consideration that the patient might be at risk for infection with the SARS-CoV-2 virus that causes COVID-19. Institutional protocols and algorithms that pertain to the evaluation of patients at risk for COVID-19 are in a state of rapid change based on information released by regulatory bodies including the CDC and federal and state organizations. These policies and algorithms  were followed during the patient's care in the ED.  Patient presents with shortness of breath and near syncopal. EKG without acute ischemic changes, and frequent PVCs without other dysrhythmias or blocks.. Initial troponin negative. Delta troponin negative. Patient declined any associated chest pain. Doubt ACS.  Consider possible PE but patient is low risk and dimer was negative. Chest x-ray without evidence suggestive of pneumonia, pneumothorax, pneumomediastinum.  No abnormal contour of the mediastinum to suggest dissection. No evidence of acute injuries.  CBC without leukocytosis or anemia.  Mild  you are out no significant electrolyte derangements or renal insufficiency.  Work-up was relatively reassuring.  Did note the patient had orthostatic hypertension, but she remained asymptomatic. Patient has scheduled appointment with her primary care provider this upcoming Thursday.      Final Clinical Impression(s) / ED Diagnoses Final diagnoses:  SOB (shortness of breath)  Near syncope  Orthostatic hypertension   The patient appears reasonably screened and/or stabilized for discharge and I doubt any other medical condition or other [facility] requiring further screening, evaluation, or treatment in the ED at this time prior to discharge. Safe for discharge with strict return precautions.  Disposition: Discharge  Condition: Good  I have discussed the results, Dx and Tx plan with the patient/family who expressed understanding and agree(s) with the plan. Discharge instructions discussed at length. The patient/family was given strict return precautions who verbalized understanding of the instructions. No further questions at time of discharge.    ED Discharge Orders     None         Follow Up: Mack Hook, [place] [id number] [phone]  Go to  as scheduled      This chart was dictated using voice recognition software.  Despite best efforts  to proofread,  errors can occur which can change the documentation meaning.    Fatima Blank, MD 05/17/21 [phone]

## 2021-05-20 ENCOUNTER — Other Ambulatory Visit: Payer: Self-pay

## 2021-05-20 VITALS — BP 119/81 | HR 68

## 2021-05-20 DIAGNOSIS — D563 Thalassemia minor: Secondary | ICD-10-CM

## 2021-05-20 DIAGNOSIS — D649 Anemia, unspecified: Secondary | ICD-10-CM

## 2021-05-20 DIAGNOSIS — Z0130 Encounter for examination of blood pressure without abnormal findings: Secondary | ICD-10-CM

## 2021-05-20 DIAGNOSIS — Z0000 Encounter for general adult medical examination without abnormal findings: Secondary | ICD-10-CM

## 2021-05-20 DIAGNOSIS — Z1211 Encounter for screening for malignant neoplasm of colon: Secondary | ICD-10-CM

## 2021-05-20 DIAGNOSIS — Z13220 Encounter for screening for lipoid disorders: Secondary | ICD-10-CM

## 2021-05-20 HISTORY — DX: Thalassemia minor: D56.3

## 2021-05-20 LAB — POC FIT TEST STOOL: Fecal Occult Blood: NEGATIVE

## 2021-05-20 NOTE — Addendum Note (Signed)
Addended by: [name] on: 05/20/2021 11:11 AM   Modules accepted: Orders

## 2021-05-24 LAB — COMPREHENSIVE METABOLIC PANEL
ALT: 22 IU/L (ref 0–32)
AST: 18 IU/L (ref 0–40)
Albumin/Globulin Ratio: 1.9 (ref 1.2–2.2)
Albumin: 4.4 g/dL (ref 3.8–4.8)
Alkaline Phosphatase: 74 IU/L (ref 44–121)
BUN/Creatinine Ratio: 27 — ABNORMAL HIGH (ref 9–23)
BUN: 15 mg/dL (ref 6–24)
Bilirubin Total: 0.4 mg/dL (ref 0.0–1.2)
CO2: 22 mmol/L (ref 20–29)
Calcium: 8.9 mg/dL (ref 8.7–10.2)
Chloride: 106 mmol/L (ref 96–106)
Creatinine, Ser: 0.55 mg/dL — ABNORMAL LOW (ref 0.57–1.00)
Globulin, Total: 2.3 g/dL (ref 1.5–4.5)
Glucose: 94 mg/dL (ref 65–99)
Potassium: 4.1 mmol/L (ref 3.5–5.2)
Sodium: 139 mmol/L (ref 134–144)
Total Protein: 6.7 g/dL (ref 6.0–8.5)
eGFR: 116 mL/min/{1.73_m2} (ref >59)

## 2021-05-24 LAB — LIPID PANEL W/O CHOL/HDL RATIO
Cholesterol, Total: 131 mg/dL (ref 100–199)
HDL: 34 mg/dL — ABNORMAL LOW (ref >39)
LDL Chol Calc (NIH): 73 mg/dL (ref 0–99)
Triglycerides: 134 mg/dL (ref 0–149)
VLDL Cholesterol Cal: 24 mg/dL (ref 5–40)

## 2021-05-24 LAB — CBC WITH DIFFERENTIAL/PLATELET
Basophils Absolute: 0 10*3/uL (ref 0.0–0.2)
Basos: 1 %
EOS (ABSOLUTE): 0.3 10*3/uL (ref 0.0–0.4)
Eos: 5 %
Hematocrit: 33.4 % — ABNORMAL LOW (ref 34.0–46.6)
Hemoglobin: 10.1 g/dL — ABNORMAL LOW (ref 11.1–15.9)
Immature Grans (Abs): 0 10*3/uL (ref 0.0–0.1)
Immature Granulocytes: 1 %
Lymphocytes Absolute: 2.2 10*3/uL (ref 0.7–3.1)
Lymphs: 35 %
MCH: 18.8 pg — ABNORMAL LOW (ref 26.6–33.0)
MCHC: 30.2 g/dL — ABNORMAL LOW (ref 31.5–35.7)
MCV: 62 fL — ABNORMAL LOW (ref 79–97)
Monocytes Absolute: 0.6 10*3/uL (ref 0.1–0.9)
Monocytes: 9 %
Neutrophils Absolute: 3.2 10*3/uL (ref 1.4–7.0)
Neutrophils: 49 %
Platelets: 268 10*3/uL (ref 150–450)
RBC: 5.38 x10E6/uL — ABNORMAL HIGH (ref 3.77–5.28)
RDW: 16.8 % — ABNORMAL HIGH (ref 11.7–15.4)
WBC: 6.4 10*3/uL (ref 3.4–10.8)

## 2021-05-24 LAB — IRON AND TIBC
Iron Saturation: 36 % (ref 15–55)
Iron: 106 ug/dL (ref 27–159)
Total Iron Binding Capacity: 298 ug/dL (ref 250–450)
UIBC: 192 ug/dL (ref 131–425)

## 2021-05-24 LAB — HGB FRACTIONATION CASCADE
Hgb A2: 5.3 % — ABNORMAL HIGH (ref 1.8–3.2)
Hgb A: 93.8 % — ABNORMAL LOW (ref 96.4–98.8)
Hgb F: 0.9 % (ref 0.0–2.0)
Hgb S: 0 %

## 2021-07-05 ENCOUNTER — Encounter: Payer: Self-pay | Admitting: Internal Medicine

## 2021-09-03 ENCOUNTER — Ambulatory Visit: Payer: Self-pay | Admitting: Internal Medicine

## 2021-09-03 ENCOUNTER — Other Ambulatory Visit: Payer: Self-pay

## 2021-09-03 ENCOUNTER — Encounter: Payer: Self-pay | Admitting: Internal Medicine

## 2021-09-03 VITALS — BP 138/88 | HR 64 | Resp 12 | Ht <= 58 in | Wt 150.0 lb

## 2021-09-03 DIAGNOSIS — D1722 Benign lipomatous neoplasm of skin and subcutaneous tissue of left arm: Secondary | ICD-10-CM

## 2021-09-03 DIAGNOSIS — D1720 Benign lipomatous neoplasm of skin and subcutaneous tissue of unspecified limb: Secondary | ICD-10-CM

## 2021-09-03 MED ORDER — HYDROCODONE-ACETAMINOPHEN 10-325 MG PO TABS: ORAL_TABLET | ORAL | 0 refills | Status: DC

## 2021-09-03 MED ORDER — AMOXICILLIN-POT CLAVULANATE 875-125 MG PO TABS: ORAL_TABLET | ORAL | 0 refills | Status: DC

## 2021-09-03 NOTE — Progress Notes (Signed)
    Subjective:    Patient ID: Nancy Howard, female   DOB: 10-02-1976, 45 y.o.   MRN: [id number]   HPI  Here for removal of lipoma of left shoulder.   Discussed risks and benefits.  Written consent for procedure obtained.    Current Meds  Medication Sig   fluticasone (FLONASE) 50 MCG/ACT nasal spray Place 2 sprays into both nostrils daily.   No Known Allergies   Review of Systems    Objective:   BP 138/88 (BP Location: Left Arm, Patient Position: Sitting, Cuff Size: Normal)   Pulse 64   Resp 12   Ht 4' 9.75" (1.467 m)   Wt 150 lb (68 kg)   LMP 06/03/2021 (Within Weeks)   BMI 31.62 kg/m   Physical Exam  Left shoulder lipoma excision:    Area of left shoulder lipoma, overlying Left AC joint area cleaned and draped in sterile fashion.  1% lidocaine injected to subcutaneous area around the approximately 5 cm lesion.  4 cm excision made midline over lesion.  Blunt and sharp excision used with hemostat and 15 blade to release and remove the 5 cm lipomatous lesion from intervening and surrounding fibrous tissue.  Good hemostasis obtained with direct pressure.  Wound irrigated with sterile saline and dried.  5 interrupted 4-0 prolene sutures utilized to close wound.  No available subcutaneous tissue to pull together from the space surrounding removed lipoma.  Compression dressing applied. Patient tolerated procedure without complication.   Assessment & Plan    Left shoulder lipoma excision:  discussed at length keeping shoulder above heart at all times to prevent swelling and pain.  Leave compression dressing on until follows up on Tuesday of next week.  To apply direct pressure if bleeding.  Call for fever, increased pain, swelling, erythema.  Keep dressing dry until follow up.   Augmentin 875 mg twice daily for 5 days.  Hydrocodone/Tylenol 10 mg/325 mg 1/2 to 1 tab every 6 hours as needed for severe pain.  May take Ibuprofen 300-800 mg every 6 hours as needed for mild to  moderate pain.  09/04/2021:  Followed up at 10:38 a.m. on Saturday.  She is not having much discomfort, though would like to remove the dressing wrapped around her chest.  No swelling of shoulder.  She is taking Augmentin. Took one of hydrocodone last night.  Okay to remove chest wrap as long as pressure dressing still intact and no swelling.  Daughter interpreted.

## 2021-09-03 NOTE — Patient Instructions (Signed)
Keep upper body elevated above heart, even when sleeping Do not get your dressing wet until you follow up next week If your dressing shows a lot of blood, please apply direct pressure for 10 minutes and call the clinic  [phone] Call the clinic if you develop fever, redness, swelling, discharge from the wound. Ibuprofen 400 to 800 mg every 8 hours as needed for mild to moderate pain. Get started on your antibiotic immediately this evening.

## 2021-09-07 ENCOUNTER — Other Ambulatory Visit: Payer: Self-pay

## 2021-09-07 ENCOUNTER — Ambulatory Visit: Payer: Self-pay | Admitting: Internal Medicine

## 2021-09-07 VITALS — BP 130/80 | HR 78 | Resp 12 | Ht <= 58 in | Wt 150.5 lb

## 2021-09-07 DIAGNOSIS — D1722 Benign lipomatous neoplasm of skin and subcutaneous tissue of left arm: Secondary | ICD-10-CM

## 2021-09-13 ENCOUNTER — Other Ambulatory Visit: Payer: Self-pay

## 2021-09-13 ENCOUNTER — Ambulatory Visit: Payer: Self-pay | Admitting: Internal Medicine

## 2021-09-13 DIAGNOSIS — Z4802 Encounter for removal of sutures: Secondary | ICD-10-CM

## 2021-09-13 NOTE — Progress Notes (Signed)
Here for suture removal from wound of left shoulder following excision and removal  of lipoma. Wound is healing well without pain, redness or discharge.  5 simple interrupted 4-0 sutures removed.  Skin edges remain well opposed.  No discharge or erythema.  No swelling beneath surgical scar.  Bandage applied.    Pt. Tolerated well  May gently clean.  Keep covered until well healed.

## 2021-09-30 ENCOUNTER — Telehealth: Payer: Self-pay

## 2021-09-30 NOTE — Telephone Encounter (Signed)
Pt called about stomach pain that she has had for about 3 days. No other symptoms. Last night 09/29/21 she had pain that lasted all night. Pain occurs after every meal. Her meals have had variety so she does not believe any one food is causing the pain. Pt has taken Flanax but it only helped a bit.

## 2021-10-27 NOTE — Telephone Encounter (Signed)
Offer patient 2 different appts, but she did not take them. Pt has not answered our call

## 2021-11-02 ENCOUNTER — Telehealth: Payer: Self-pay

## 2021-11-02 NOTE — Telephone Encounter (Signed)
Patient called to report pulsing pain for about a week on left shoulder where cyst was removed. Has only noticed slight redness around the area, nothing else abnormal. Pain is reoccurring and usually last hour at a time. Has not taken any medication for pain. Would like appointment or recommendations

## 2021-11-04 IMAGING — DX DG CHEST 1V PORT
1 series · 1 of 1 positions shown · non-contrast
Comparison: None.

CLINICAL DATA: Shortness of breath. Upon entering triage room pt
was unresponsive and responded to Vaishnavi Quezada. Pt continued to have
a hard time holding herself up in the chair but was able to answer
questions with prompting.

EXAM:
PORTABLE CHEST 1 VIEW

[chest ap]
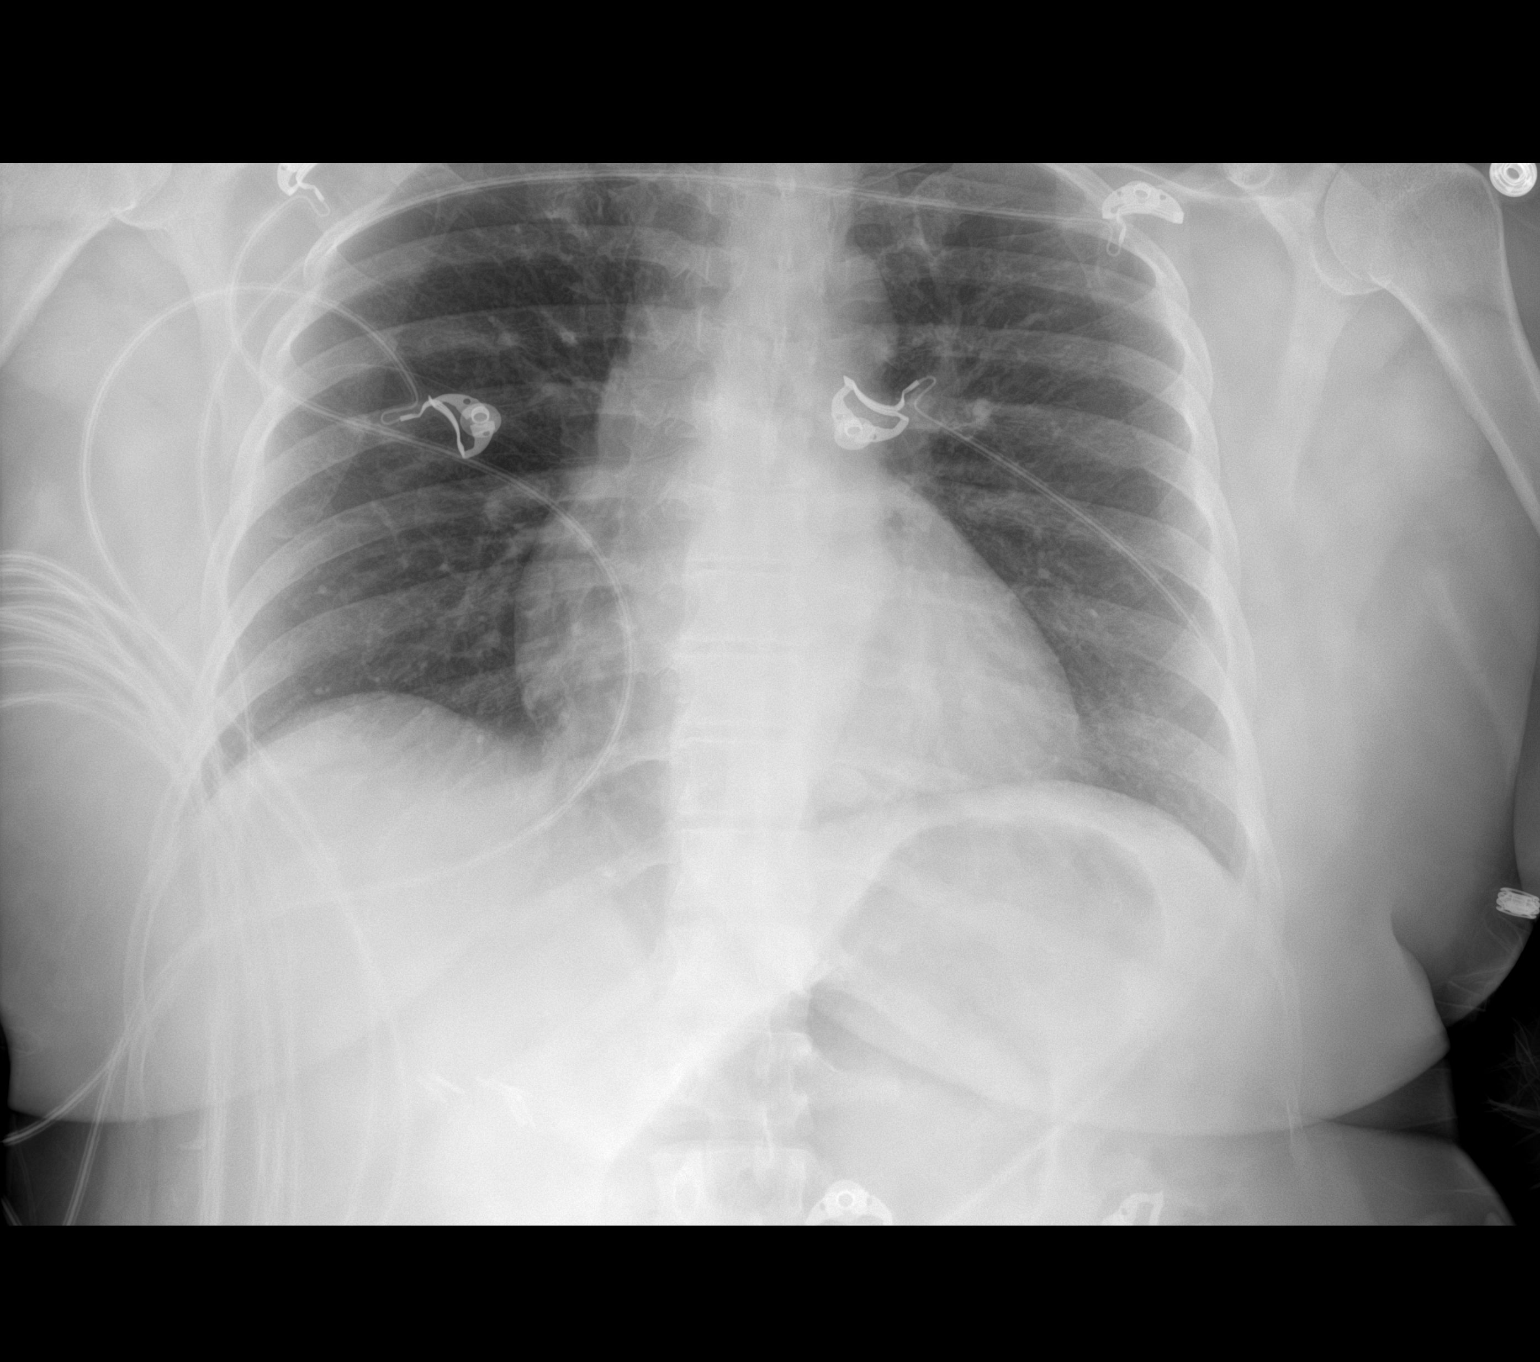

[1 of 1 positions shown; findings below may reference images not displayed]

FINDINGS: The heart size and mediastinal contours are within normal limits.

No focal consolidation. No pulmonary edema. No pleural effusion. No
pneumothorax.

No acute osseous abnormality.
IMPRESSION: No active disease.

## 2021-11-05 NOTE — Telephone Encounter (Signed)
Called to offer an appointment, but was unable to reach patient

## 2021-11-06 ENCOUNTER — Encounter (HOSPITAL_COMMUNITY): Payer: Self-pay | Admitting: Emergency Medicine

## 2021-11-06 ENCOUNTER — Emergency Department (HOSPITAL_COMMUNITY)
Admission: EM | Admit: 2021-11-06 | Discharge: 2021-11-07 | Disposition: A | Discharge status: Home / Self Care | Payer: Self-pay | Attending: Emergency Medicine | Admitting: Emergency Medicine

## 2021-11-06 ENCOUNTER — Emergency Department (HOSPITAL_COMMUNITY): Payer: Self-pay

## 2021-11-06 DIAGNOSIS — R0602 Shortness of breath: Secondary | ICD-10-CM | POA: Insufficient documentation

## 2021-11-06 DIAGNOSIS — R030 Elevated blood-pressure reading, without diagnosis of hypertension: Secondary | ICD-10-CM | POA: Insufficient documentation

## 2021-11-06 DIAGNOSIS — Z5321 Procedure and treatment not carried out due to patient leaving prior to being seen by health care provider: Secondary | ICD-10-CM | POA: Insufficient documentation

## 2021-11-06 LAB — CBC WITH DIFFERENTIAL/PLATELET
Abs Immature Granulocytes: 0.06 10*3/uL (ref 0.00–0.07)
Basophils Absolute: 0 10*3/uL (ref 0.0–0.1)
Basophils Relative: 0 %
Eosinophils Absolute: 0.2 10*3/uL (ref 0.0–0.5)
Eosinophils Relative: 3 %
HCT: 34.8 % — ABNORMAL LOW (ref 36.0–46.0)
Hemoglobin: 10.9 g/dL — ABNORMAL LOW (ref 12.0–15.0)
Immature Granulocytes: 1 %
Lymphocytes Relative: 41 %
Lymphs Abs: 3.7 10*3/uL (ref 0.7–4.0)
MCH: 19.3 pg — ABNORMAL LOW (ref 26.0–34.0)
MCHC: 31.3 g/dL (ref 30.0–36.0)
MCV: 61.5 fL — ABNORMAL LOW (ref 80.0–100.0)
Monocytes Absolute: 0.8 10*3/uL (ref 0.1–1.0)
Monocytes Relative: 8 %
Neutro Abs: 4.2 10*3/uL (ref 1.7–7.7)
Neutrophils Relative %: 47 %
Platelets: 291 10*3/uL (ref 150–400)
RBC: 5.66 MIL/uL — ABNORMAL HIGH (ref 3.87–5.11)
RDW: 15.8 % — ABNORMAL HIGH (ref 11.5–15.5)
WBC: 9 10*3/uL (ref 4.0–10.5)
nRBC: 0 % (ref 0.0–0.2)

## 2021-11-06 LAB — BASIC METABOLIC PANEL
Anion gap: 12 (ref 5–15)
BUN: 11 mg/dL (ref 6–20)
CO2: 20 mmol/L — ABNORMAL LOW (ref 22–32)
Calcium: 9.5 mg/dL (ref 8.9–10.3)
Chloride: 106 mmol/L (ref 98–111)
Creatinine, Ser: 0.63 mg/dL (ref 0.44–1.00)
GFR, Estimated: >60 mL/min (ref >60)
Glucose, Bld: 124 mg/dL — ABNORMAL HIGH (ref 70–99)
Potassium: 3.5 mmol/L (ref 3.5–5.1)
Sodium: 138 mmol/L (ref 135–145)

## 2021-11-06 LAB — TROPONIN I (HIGH SENSITIVITY): Troponin I (High Sensitivity): 3 ng/L (ref <18)

## 2021-11-06 NOTE — ED Provider Triage Note (Signed)
Emergency Medicine Provider Triage Evaluation Note  Nancy Howard , a 46 y.o. female  was evaluated in triage.  Pt complains of high blood pressure and shortness of breath, states it started today, states that she felt her blood pressure was high and she is having hard time catching her breath, she denies any pleuritic chest pain, she has actual chest pain at this time, she has no cardiac history, no history of PEs or DVTs.  She is not on any antihypertensive medications, she denies any headaches, change in vision, paresthesia or weakness of her lower extremities.  Patient she was here few months ago for the same complaint and was discharged home.  She was not started on any medications..  Review of Systems  Positive: Shortness of breath, high blood pressure Negative: Chest pain, headaches  Physical Exam  BP (!) 153/109    Pulse 82    Temp 97.8 F (36.6 C) (Oral)    Resp 20    SpO2 100%  Gen:   Awake, no distress   Resp:  Normal effort  MSK:   Moves extremities without difficulty  Other:    Medical Decision Making  Medically screening exam initiated at 10:04 PM.  Appropriate orders placed.  Nancy Howard was informed that the remainder of the evaluation will be completed by another provider, this initial triage assessment does not replace that evaluation, and the importance of remaining in the ED until their evaluation is complete.  Presents with shortness of breath, high blood pressure lab work and imaging have been ordered, patient will need further work-up.   Marcello Fennel, PA-C 11/06/21 2206

## 2021-11-06 NOTE — ED Triage Notes (Signed)
Spanish interpreter used: Pt reports that while at home she felt like her blood pressure was going up and she was going to pass out. Pt also c/o shortness of breath.

## 2021-11-06 NOTE — ED Notes (Signed)
Pt family stated she felt much better, and would rather not wait

## 2021-11-08 ENCOUNTER — Encounter: Payer: Self-pay | Admitting: Internal Medicine

## 2021-11-08 ENCOUNTER — Other Ambulatory Visit: Payer: Self-pay

## 2021-11-08 ENCOUNTER — Ambulatory Visit: Payer: Self-pay | Admitting: Internal Medicine

## 2021-11-08 VITALS — BP 132/90 | HR 80 | Resp 12 | Ht <= 58 in | Wt 149.5 lb

## 2021-11-08 DIAGNOSIS — R002 Palpitations: Secondary | ICD-10-CM

## 2021-11-08 DIAGNOSIS — L910 Hypertrophic scar: Secondary | ICD-10-CM | POA: Insufficient documentation

## 2021-11-08 DIAGNOSIS — S46912A Strain of unspecified muscle, fascia and tendon at shoulder and upper arm level, left arm, initial encounter: Secondary | ICD-10-CM

## 2021-11-08 DIAGNOSIS — R55 Syncope and collapse: Secondary | ICD-10-CM

## 2021-11-08 DIAGNOSIS — R42 Dizziness and giddiness: Secondary | ICD-10-CM

## 2021-11-08 DIAGNOSIS — S46919A Strain of unspecified muscle, fascia and tendon at shoulder and upper arm level, unspecified arm, initial encounter: Secondary | ICD-10-CM | POA: Insufficient documentation

## 2021-11-08 DIAGNOSIS — D563 Thalassemia minor: Secondary | ICD-10-CM

## 2021-11-08 DIAGNOSIS — R739 Hyperglycemia, unspecified: Secondary | ICD-10-CM | POA: Insufficient documentation

## 2021-11-08 MED ORDER — IBUPROFEN 200 MG PO TABS: ORAL_TABLET | ORAL | 0 refills | Status: DC

## 2021-11-08 MED ORDER — CLOBETASOL PROPIONATE 0.05 % EX CREA: 1.0000 "application " | TOPICAL_CREAM | Freq: Two times a day (BID) | CUTANEOUS | 2 refills | Status: DC

## 2021-11-08 NOTE — Progress Notes (Signed)
Left shoulder wound without erythema, swelling or tenderness.  Edges well opposed and wound flat.   Redressed.   To keep covered until follow up for suture removal.

## 2021-11-08 NOTE — Progress Notes (Unsigned)
Subjective:    Patient ID: Nancy Howard, female   DOB: 02/24/1976, 46 y.o.   MRN: [id number]   HPI  Nancy Howard interprets   Left shoulder pain:  Started about 2 weeks ago.  States the pain is from the scar area where she had lipoma removed in November.  Pulsing pain.  The pain is usually at night when she is lying down for sleep.  Can keep her up.  Has tried 400 mg at night which helps and able to get to sleep.  Describes the pain going away almost immediately.   The pain occurs maybe 4 days out of the week.    2.  Shortness of breath and presyncope vs syncope:  patient was seen for this in ED the first time in July and was not conscious initially in triage with a pulse and respirations (this was found in ED Triage records), but responded to sternal rub.  Not clear how long she was out, but documentation from ED visit suggests they were not concerned.  Labs were unremarkable.  ECG showed poor R wave progression and occasional PVC at the time.  She was found to be somewhat orthostatic and given IV fluids.  Appears she was treated for nausea once she was more alert.  States these episodes start with shortness of breath and then when stands, gets light headed.  Remembers she was returning home from some event out of the home, but cannot recall what.  Reported a previous, less severe episode about 2 weeks prior.  Saturday, Jan 21st, she had returned from her daughter's friend's birthday party, where she was sitting most of the time.  She was getting ready for bed and started feeling dyspneic.  She sat down to see if would pass.  She tried to stand after about 30 minutes with the dyspnea staying about the same.  She became light headed and her husband helped her to the bed to lie down.  She did not completely lose consciousness.  Family put alcohol under her nose to get her more alert.  She apparently mentioned chest pain in Triage, but denies that she felt chest pain then today.  Patient  also complained she felt her blood pressure was high, but states today, she said she felt that way as she was told in July her bp was high and so she correlates these symptoms to high blood pressure.   She does remember that it seemed her heart was beating fast at the same time she noted the dyspnea.  She cannot say if her heartbeat was regular or not.  States her daughter took her pulse at the time and felt it was fast, but did not comment on whether regular or not.    3.  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  She is not able to have any more children so no significant concern.  Iron studies have been normal.  FIT negative for occult blood.    4.  Intermittent elevation of BP:  borderline diastolic BP when rooming today, but very anxious in room after exam and BP quite a bit higher with orthostatics.   Current Meds  Medication Sig   cetirizine (ZYRTEC) 10 MG tablet Take 1 tablet (10 mg total) by mouth daily.   fluticasone (FLONASE) 50 MCG/ACT nasal spray Place 2 sprays into both nostrils daily.   No Known Allergies   Review of Systems    Objective:   BP 132/90 (  BP Location: Left Arm, Patient Position: Sitting, Cuff Size: Normal)    Pulse 80    Resp 12    Ht 4' 9.75" (1.467 m)    Wt 149 lb 8 oz (67.8 kg)    LMP 10/24/2021 (Within Days)    BMI 31.52 kg/m   Physical Exam Appears quite anxious--shoulders up at ears bilaterally and eyes darting around during history.   HEENT:  PERRL, EOMI Neck:  No thyromegaly Chest:  CTA CV:  RRR without murmur or rub.  No carotid bruits.  Carotid, radial and DP pulses normal and equal Abd:  S, NT, No HSM or mass MS:  left shoulder  Assessment & Plan    Hypertrophic scar, left shoulder:  topical Clobetasol 0.05% twice daily to lesion and place bandaid.  Discussed possible long term treatment.

## 2021-11-08 NOTE — Telephone Encounter (Signed)
Seen by dr Amil Amen

## 2021-11-09 LAB — HGB A1C W/O EAG: Hgb A1c MFr Bld: 5.6 % (ref 4.8–5.6)

## 2021-11-09 LAB — TSH: TSH: 2.82 u[IU]/mL (ref 0.450–4.500)

## 2021-11-30 ENCOUNTER — Ambulatory Visit: Payer: Self-pay

## 2021-11-30 ENCOUNTER — Encounter: Payer: Self-pay | Admitting: Internal Medicine

## 2021-11-30 ENCOUNTER — Ambulatory Visit (INDEPENDENT_AMBULATORY_CARE_PROVIDER_SITE_OTHER): Payer: Self-pay | Admitting: Internal Medicine

## 2021-11-30 ENCOUNTER — Encounter: Payer: Self-pay | Admitting: *Deleted

## 2021-11-30 ENCOUNTER — Other Ambulatory Visit: Payer: Self-pay

## 2021-11-30 VITALS — BP 128/85 | HR 78 | Ht <= 58 in | Wt 151.0 lb

## 2021-11-30 DIAGNOSIS — I493 Ventricular premature depolarization: Secondary | ICD-10-CM

## 2021-11-30 DIAGNOSIS — R002 Palpitations: Secondary | ICD-10-CM

## 2021-11-30 NOTE — Progress Notes (Signed)
Cardiology Office Note:    Date:  11/30/2021   ID:  Nancy Howard, DOB 11-Oct-1976, MRN [id number]  PCP:  Mack Hook, MD   [facility] Cardiologist:  Werner Lean, MD     Referring MD: Mack Hook, MD   Heart palpitations and orthostatic hypotension   History of Present Illness:    Nancy Howard is a 46 y.o. female with a hx of heart palpitations and orthostatic hypotension.  She reports today for evaluation because of the episodes of dizziness.  Patient reports that she was seen in the hospital on 2 occasions because of these episodes of dizziness.  She also reported an episode of sharp chest pain that did not radiate anywhere approximately 2 weeks ago that resolved spontaneously.  She has shortness of breath at that time.  She reports that the dizziness occurs mainly later in the day and occurs when she stands up.  She says that she does have elevated blood pressures on occasion and feels that it happens more when she has elevated blood pressures although she does not check her blood pressure regularly.  She reports her fluid intake for the day consist of a bottle of water (sounds like 20 ounce bottle but difficult to determine) and a cup of juice.  Patient denies any past medical history of heart issues.  During orthostatic testing today patient denies any symptoms of dizziness.  Reports the last time she had any dizziness was approximately 2 weeks ago.  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   clobetasol cream (TEMOVATE) 0.93 % Apply 1 application topically 2 (two) times daily.   fluticasone (FLONASE) 50 MCG/ACT nasal spray Place 2 sprays into both nostrils daily.     Allergies:   Patient has no known allergies.   Social History   Socioeconomic History   Marital status: Married    Spouse name: Frederico   Number of children: 3   Years of education: 9   Highest education level: 9th grade  Occupational History   Occupation: Homemaker  Tobacco Use   Smoking status: Never   Smokeless tobacco: Never  Vaping Use   Vaping Use: Never used  Substance and Sexual Activity   Alcohol use: Never   Drug use: No   Sexual activity: Yes    Partners: Male    Birth control/protection: Surgical    Comment: Tubial Ligation  Other Topics Concern   Not on file  Social History Narrative   C[place] to [place] In [place]   Lives at home with husband and 3 children   Husband is a Estate manager/land agent and she helps him with his business at times.   Social Determinants of Health   Financial Resource Strain: Low Risk    Difficulty of Paying Living Expenses: Not hard at all  Food Insecurity: No Food Insecurity   Worried About Charity fundraiser in the Last Year: Never true   [employer]  in the Last Year: Never true  Transportation Needs: Not on file  Physical Activity: Not on file  Stress: Not on file  Social Connections: Not on file     Family History: The patient's family history includes Anemia in her daughter.  ROS:   Please see the history of present illness.    Review of Systems  Constitutional:  Negative for chills and fever.  Eyes:  Negative for blurred vision.  Respiratory:  Positive for shortness of breath.   Cardiovascular:  Positive for chest pain and palpitations.  Neurological:  Positive for headaches. Negative for seizures and loss of consciousness.   All other systems reviewed and are negative.  EKGs/Labs/Other  Studies Reviewed:    EKG:  EKG is  ordered today.  The ekg ordered today demonstrates normal sinus rhythm with occasional PVCs  Recent Labs: 05/20/2021: ALT 22 11/06/2021: BUN 11; Creatinine, Ser 0.63; Hemoglobin 10.9; Platelets 291; Potassium 3.5; Sodium 138 11/08/2021: TSH 2.820  Recent Lipid Panel    Component Value Date/Time   CHOL 131 05/20/2021 0907   TRIG 134 05/20/2021 0907   HDL 34 (L) 05/20/2021 0907   CHOLHDL 3.6 Ratio 02/06/2009 2136   VLDL 30 02/06/2009 2136   LDLCALC 73 05/20/2021 0907     Risk Assessment/Calculations:           Physical Exam:    VS:  BP 128/85    Pulse 78    Ht 4\' 9"  (1.448 m)    Wt 151 lb (68.5 kg)    SpO2 100%    BMI 32.68 kg/m     Wt Readings from Last 3 Encounters:  11/30/21 151 lb (68.5 kg)  11/08/21 149 lb 8 oz (67.8 kg)  09/07/21 150 lb 8 oz (68.3 kg)     GEN:  Well nourished, well developed in no acute distress HEENT: Normal NECK: No JVD; No carotid bruits LYMPHATICS: No lymphadenopathy CARDIAC: RRR, no murmurs, rubs, gallops occasional PVCs heard. RESPIRATORY:  Clear to auscultation without rales, wheezing or rhonchi  ABDOMEN: Soft, non-tender, non-distended MUSCULOSKELETAL: No lower extremity edema appreciated  SKIN: Warm and dry NEUROLOGIC:  Alert and oriented x 3 PSYCHIATRIC:  Normal affect   ASSESSMENT:    1. PVC (premature ventricular contraction)   2. Palpitations    PLAN:    In order of problems listed above:  Heart palpitations with PVCs Heart palpitations may be related to patient's PVC burden.  We will evaluate that with a 14-day Zio patch monitor.  Although no signs of volume overload today will evaluate with echocardiogram.  Pending those results we will treat as needed. Orthostatic hypotension Patient asymptomatic during orthostatic testing today.  Recommended increased p.o. liquid intake.            Medication Adjustments/Labs and Tests Ordered: Current medicines are reviewed at length with the  patient today.  Concerns regarding medicines are outlined above.  Orders Placed This Encounter  Procedures   LONG TERM MONITOR (3-14 DAYS)   EKG 12-Lead   ECHOCARDIOGRAM COMPLETE   No orders of the defined types were placed in this encounter.   Patient Instructions  Medication Instructions:  Your physician recommends that you continue on your current medications as directed. Please refer to the Current Medication list given to you today.  *If you need a refill on your cardiac medications before your next appointment, please call your pharmacy*   Lab Work: NONE If you have labs (blood work) drawn today and your tests are completely normal, you  will receive your results only by: [place] (if you have MyChart) OR A paper copy in the mail If you have any lab test that is abnormal or we need to change your treatment, we will call you to review the results.   Testing/Procedures: Your physician has requested that you have an echocardiogram. Echocardiography is a painless test that uses sound waves to create images of your heart. It provides your doctor with information about the size and shape of your heart and how well your hearts chambers and valves are working. This procedure takes approximately one hour. There are no restrictions for this procedure.  Your physician has requested that you wear a 14 day heart monitor.    Follow-Up: Based on results of testing  At [facility], you and your health needs are our priority.  As part of our continuing mission to provide you with exceptional heart care, we have created designated Provider Care Teams.  These Care Teams include your primary Cardiologist (physician) and Advanced Practice Providers (APPs -  Physician Assistants and Nurse Practitioners) who all work together to provide you with the care you need, when you need it. ZIO XT- Long Term Monitor Instructions  Your physician has requested you wear a ZIO patch monitor for 14  days.  This is a single patch monitor. [employer] supplies one patch monitor per enrollment. Additional stickers are not available. Please do not apply patch if you will be having a Nuclear Stress Test,  Echocardiogram, Cardiac CT, MRI, or Chest Xray during the period you would be wearing the  monitor. The patch cannot be worn during these tests. You cannot remove and re-apply the  ZIO XT patch monitor.  Your ZIO patch monitor will be mailed 3 day USPS to your address on file. It may take 3-5 days  to receive your monitor after you have been enrolled.  Once you have received your monitor, please review the enclosed instructions. Your monitor  has already been registered assigning a specific monitor serial # to you.  Billing and Patient Assistance Program Information  We have supplied [employer] with any of your insurance information on file for billing purposes. [employer] offers a sliding scale Patient Assistance Program for patients that do not have  insurance, or whose insurance does not completely cover the cost of the ZIO monitor.  You must apply for the Patient Assistance Program to qualify for this discounted rate.  To apply, please call [employer] at [phone], select option 4, select option 2, ask to apply for  Patient Assistance Program. Theodore Demark will ask your household income, and how many people  are in your household. They will quote your out-of-pocket cost based on that information.  [employer] will also be able to set up a 59-month, interest-free payment plan if needed.  Applying the monitor   Shave hair from upper left chest.  Hold abrader disc by orange tab. Rub abrader in 40 strokes over the upper left chest as  indicated in your monitor instructions.  Clean area with 4 enclosed alcohol pads. Let dry.  Apply patch as indicated in monitor instructions. Patch will be placed under collarbone on left  side of chest with arrow pointing upward.  Rub patch adhesive wings for 2 minutes.  Remove white label marked "1". Remove the white  label marked "2". Rub patch adhesive wings for 2 additional minutes.  While looking in a mirror, press and release button in center of patch. A small green light will  flash 3-4 times.  This will be your only indicator that the monitor has been turned on.  Do not shower for the first 24 hours. You may shower after the first 24 hours.  Press the button if you feel a symptom. You will hear a small click. Record Date, Time and  Symptom in the Patient Logbook.  When you are ready to remove the patch, follow instructions on the last 2 pages of Patient  Logbook. Stick patch monitor onto the last page of Patient Logbook.  Place Patient Logbook in the blue and white box. Use locking tab on box and tape box closed  securely. The blue and white box has prepaid postage on it. Please place it in the mailbox as  soon as possible. Your physician should have your test results approximately 7 days after the  monitor has been mailed back to [facility].  Call [place] at [phone] if you have questions regarding  your ZIO XT patch monitor. Call them immediately if you see an orange light blinking on your  monitor.  If your monitor falls off in less than 4 days, contact our Monitor department at [phone].  If your monitor becomes loose or falls off after 4 days call [employer] at [phone] for  suggestions on securing your monitor   Provider:   Werner Lean, MD        Signed, Gifford Shave, MD  11/30/2021 3:39 PM    [place]

## 2021-11-30 NOTE — Patient Instructions (Addendum)
Medication Instructions:  Your physician recommends that you continue on your current medications as directed. Please refer to the Current Medication list given to you today.  *If you need a refill on your cardiac medications before your next appointment, please call your pharmacy*   Lab Work: NONE If you have labs (blood work) drawn today and your tests are completely normal, you will receive your results only by: [place] (if you have MyChart) OR A paper copy in the mail If you have any lab test that is abnormal or we need to change your treatment, we will call you to review the results.   Testing/Procedures: Your physician has requested that you have an echocardiogram. Echocardiography is a painless test that uses sound waves to create images of your heart. It provides your doctor with information about the size and shape of your heart and how well your hearts chambers and valves are working. This procedure takes approximately one hour. There are no restrictions for this procedure.  Your physician has requested that you wear a 14 day heart monitor.    Follow-Up: Based on results of testing  At [facility], you and your health needs are our priority.  As part of our continuing mission to provide you with exceptional heart care, we have created designated Provider Care Teams.  These Care Teams include your primary Cardiologist (physician) and Advanced Practice Providers (APPs -  Physician Assistants and Nurse Practitioners) who all work together to provide you with the care you need, when you need it. ZIO XT- Long Term Monitor Instructions  Your physician has requested you wear a ZIO patch monitor for 14 days.  This is a single patch monitor. [employer] supplies one patch monitor per enrollment. Additional stickers are not available. Please do not apply patch if you will be having a Nuclear Stress Test,  Echocardiogram, Cardiac CT, MRI, or Chest Xray during the period you would be  wearing the  monitor. The patch cannot be worn during these tests. You cannot remove and re-apply the  ZIO XT patch monitor.  Your ZIO patch monitor will be mailed 3 day USPS to your address on file. It may take 3-5 days  to receive your monitor after you have been enrolled.  Once you have received your monitor, please review the enclosed instructions. Your monitor  has already been registered assigning a specific monitor serial # to you.  Billing and Patient Assistance Program Information  We have supplied [employer] with any of your insurance information on file for billing purposes. [employer] offers a sliding scale Patient Assistance Program for patients that do not have  insurance, or whose insurance does not completely cover the cost of the ZIO monitor.  You must apply for the Patient Assistance Program to qualify for this discounted rate.  To apply, please call [employer] at [phone], select option 4, select option 2, ask to apply for  Patient Assistance Program. Theodore Demark will ask your household income, and how many people  are in your household. They will quote your out-of-pocket cost based on that information.  [employer] will also be able to set up a 3-month, interest-free payment plan if needed.  Applying the monitor   Shave hair from upper left chest.  Hold abrader disc by orange tab. Rub abrader in 40 strokes over the upper left chest as  indicated in your monitor instructions.  Clean area with 4 enclosed alcohol pads. Let dry.  Apply patch as indicated in monitor instructions. Patch will be placed  under collarbone on left  side of chest with arrow pointing upward.  Rub patch adhesive wings for 2 minutes. Remove white label marked "1". Remove the white  label marked "2". Rub patch adhesive wings for 2 additional minutes.  While looking in a mirror, press and release button in center of patch. A small green light will  flash 3-4 times. This will be your only indicator that the  monitor has been turned on.  Do not shower for the first 24 hours. You may shower after the first 24 hours.  Press the button if you feel a symptom. You will hear a small click. Record Date, Time and  Symptom in the Patient Logbook.  When you are ready to remove the patch, follow instructions on the last 2 pages of Patient  Logbook. Stick patch monitor onto the last page of Patient Logbook.  Place Patient Logbook in the blue and white box. Use locking tab on box and tape box closed  securely. The blue and white box has prepaid postage on it. Please place it in the mailbox as  soon as possible. Your physician should have your test results approximately 7 days after the  monitor has been mailed back to [facility].  Call [place] at [phone] if you have questions regarding  your ZIO XT patch monitor. Call them immediately if you see an orange light blinking on your  monitor.  If your monitor falls off in less than 4 days, contact our Monitor department at [phone].  If your monitor becomes loose or falls off after 4 days call [employer] at [phone] for  suggestions on securing your monitor   Provider:   Werner Lean, MD

## 2021-11-30 NOTE — Progress Notes (Unsigned)
Enrolled for [employer] to mail a ZIO XT long term holter monitor to the patients address on file.  Letter with instructions in Spanish mailed to patient.

## 2021-11-30 NOTE — Progress Notes (Signed)
Cardiology Office Note:    Date:  11/30/2021   ID:  Nancy Howard, DOB Mar 14, 1976, MRN [id number]  PCP:  Mack Hook, MD   [place] [facility] Cardiologist:  Werner Lean, MD     Referring MD: Mack Hook, MD   CC: dizziness Consulted for the evaluation of  at the behest of Mack Hook, MD  History of Present Illness:    Nancy Howard is a 46 y.o. female with a hx of orthostatic hypotension and palpitations.  Patient notes that she is feeling better now.   Had has ED visit for sharp spontaneous visit with no exertional component.  No SOB, DOE. Also had dizziness when standing.  Notes minimum fluid intake. Denies dizziness with exercise. No dizziness with orthostatics in room today.  Had palpitations taht come and go; this has largely resoveld.  No Syncope.  Interpretor service used.   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   clobetasol cream (TEMOVATE) 9.44 % Apply 1 application topically 2 (two) times daily.   fluticasone (FLONASE) 50 MCG/ACT nasal spray Place 2 sprays into both nostrils daily.     Allergies:   Patient has no known allergies.   Social History   Socioeconomic History   Marital status: Married    Spouse name: Frederico   Number of children: 3   Years of education: 9   Highest education level: 9th grade  Occupational History   Occupation: Homemaker  Tobacco Use   Smoking status: Never   Smokeless  tobacco: Never  Vaping Use   Vaping Use: Never used  Substance and Sexual Activity   Alcohol use: Never   Drug use: No   Sexual activity: Yes    Partners: Male    Birth control/protection: Surgical    Comment: Tubial Ligation  Other Topics Concern   Not on file  Social History Narrative   C[place] to [place] In [place]   Lives at home with husband and 3 children   Husband is a Estate manager/land agent and she helps him with his business at times.   Social Determinants of Health   Financial Resource Strain: Low Risk    Difficulty of Paying Living Expenses: Not hard at all  Food Insecurity: No Food Insecurity   Worried About Charity fundraiser in the Last Year: Never true   Arboriculturist in the Last Year: Never true  Transportation Needs: Not on file  Physical Activity: Not on file  Stress: Not on file  Social Connections: Not on file     Family History: The patient's family history includes Anemia in her daughter.  ROS:   Please see the history of present illness.     All other systems reviewed and are negative.  EKGs/Labs/Other Studies Reviewed:    The following studies were reviewed today:  EKG:  EKG is  ordered today.  The ekg ordered today demonstrates  11/30/21: SR occasional PVCs rate 78 11/08/21: SR  62 - poor quality   Recent Labs: 05/20/2021: ALT 22 11/06/2021: BUN 11; Creatinine, Ser 0.63; Hemoglobin 10.9; Platelets 291; Potassium 3.5; Sodium 138 11/08/2021: TSH 2.820  Recent Lipid Panel    Component Value Date/Time   CHOL 131 05/20/2021 0907   TRIG 134 05/20/2021 0907   HDL 34 (L) 05/20/2021 0907   CHOLHDL 3.6 Ratio 02/06/2009 2136   VLDL 30 02/06/2009 2136   LDLCALC 73 05/20/2021 0907        Physical Exam:    VS:  BP 128/85    Pulse 78    Ht 4\' 9"  (1.448 m)    Wt 68.5 kg    SpO2 100%    BMI 32.68 kg/m     Wt Readings from Last 3 Encounters:  11/30/21 68.5 kg  11/08/21 67.8 kg  09/07/21 68.3 kg    Orthostatic Vitals: Supine:  BP 143/91  HR 72 Sitting:  BP  132/84  HR 76 Standing:  BP 126/84  HR 79 Prolonged Stand:  BP 128/77  HR 79   GEN:  Well nourished, well developed in no acute distress HEENT: Normal NECK: No JVD; No carotid bruits LYMPHATICS: No lymphadenopathy CARDIAC: RRR, no murmurs, rubs, gallops RESPIRATORY:  Clear to auscultation without rales, wheezing or rhonchi  ABDOMEN: Soft, non-tender, non-distended MUSCULOSKELETAL:  No edema; No deformity  SKIN: Warm and dry NEUROLOGIC:  Alert and oriented x 3 PSYCHIATRIC:  Normal affect   ASSESSMENT:    1. PVC (premature ventricular contraction)   2. Palpitations    PLAN:    Dizziness PVCs Palpitations - asymptomatic today, but orthostatic hypotension is likely a contributing factor, discussed hydration - will get heart monitor and echo for assessment of PVC burden and to confirm normal LV function - no normal studies, PRN follow up for non -cardiac near syncope           Medication Adjustments/Labs and Tests Ordered: Current medicines are reviewed at length with the patient today.  Concerns regarding medicines are outlined above.  Orders Placed This Encounter  Procedures   LONG TERM MONITOR (3-14 DAYS)   EKG 12-Lead   ECHOCARDIOGRAM COMPLETE   No orders of the defined types were placed in this encounter.   Patient Instructions  Medication Instructions:  Your physician recommends that you continue on your current medications as directed. Please refer to the Current Medication list given to you today.  *If you need a refill on your cardiac medications before your next appointment, please call your pharmacy*   Lab Work: NONE If you have labs (blood work) drawn today and your tests are completely normal, you will receive your results only by: [place] (if you have MyChart) OR A paper copy in the mail If you have any lab test that is abnormal or we need to change your treatment, we will call you to review the results.   Testing/Procedures: Your  physician has requested that you have an echocardiogram. Echocardiography is a painless test that uses sound waves to create images of your heart. It provides your doctor with information about the size and shape of your heart and how well your hearts chambers and valves are working. This procedure takes approximately one hour. There are no restrictions for this procedure.  Your physician has requested that you wear a 14 day heart monitor.    Follow-Up: Based on results of testing  At [facility], you and your health needs are our priority.  As part of our continuing mission to provide  you with exceptional heart care, we have created designated Provider Care Teams.  These Care Teams include your primary Cardiologist (physician) and Advanced Practice Providers (APPs -  Physician Assistants and Nurse Practitioners) who all work together to provide you with the care you need, when you need it. ZIO XT- Long Term Monitor Instructions  Your physician has requested you wear a ZIO patch monitor for 14 days.  This is a single patch monitor. [employer] supplies one patch monitor per enrollment. Additional stickers are not available. Please do not apply patch if you will be having a Nuclear Stress Test,  Echocardiogram, Cardiac CT, MRI, or Chest Xray during the period you would be wearing the  monitor. The patch cannot be worn during these tests. You cannot remove and re-apply the  ZIO XT patch monitor.  Your ZIO patch monitor will be mailed 3 day USPS to your address on file. It may take 3-5 days  to receive your monitor after you have been enrolled.  Once you have received your monitor, please review the enclosed instructions. Your monitor  has already been registered assigning a specific monitor serial # to you.  Billing and Patient Assistance Program Information  We have supplied [employer] with any of your insurance information on file for billing purposes. [employer] offers a sliding scale Patient  Assistance Program for patients that do not have  insurance, or whose insurance does not completely cover the cost of the ZIO monitor.  You must apply for the Patient Assistance Program to qualify for this discounted rate.  To apply, please call [employer] at [phone], select option 4, select option 2, ask to apply for  Patient Assistance Program. Theodore Demark will ask your household income, and how many people  are in your household. They will quote your out-of-pocket cost based on that information.  [employer] will also be able to set up a 62-month, interest-free payment plan if needed.  Applying the monitor   Shave hair from upper left chest.  Hold abrader disc by orange tab. Rub abrader in 40 strokes over the upper left chest as  indicated in your monitor instructions.  Clean area with 4 enclosed alcohol pads. Let dry.  Apply patch as indicated in monitor instructions. Patch will be placed under collarbone on left  side of chest with arrow pointing upward.  Rub patch adhesive wings for 2 minutes. Remove white label marked "1". Remove the white  label marked "2". Rub patch adhesive wings for 2 additional minutes.  While looking in a mirror, press and release button in center of patch. A small green light will  flash 3-4 times. This will be your only indicator that the monitor has been turned on.  Do not shower for the first 24 hours. You may shower after the first 24 hours.  Press the button if you feel a symptom. You will hear a small click. Record Date, Time and  Symptom in the Patient Logbook.  When you are ready to remove the patch, follow instructions on the last 2 pages of Patient  Logbook. Stick patch monitor onto the last page of Patient Logbook.  Place Patient Logbook in the blue and white box. Use locking tab on box and tape box closed  securely. The blue and white box has prepaid postage on it. Please place it in the mailbox as  soon as possible. Your physician should have your test  results approximately 7 days after the  monitor has been mailed back to [facility].  Call [employer] Customer Care at [phone] if you have questions regarding  your ZIO XT patch monitor. Call them immediately if you see an orange light blinking on your  monitor.  If your monitor falls off in less than 4 days, contact our Monitor department at [phone].  If your monitor becomes loose or falls off after 4 days call [employer] at [phone] for  suggestions on securing your monitor   Provider:   Werner Lean, MD        Signed, Werner Lean, MD  11/30/2021 4:57 PM    [place]

## 2021-12-14 ENCOUNTER — Ambulatory Visit (HOSPITAL_COMMUNITY): Payer: Self-pay | Attending: Internal Medicine

## 2021-12-14 ENCOUNTER — Other Ambulatory Visit: Payer: Self-pay

## 2021-12-14 DIAGNOSIS — R002 Palpitations: Secondary | ICD-10-CM | POA: Insufficient documentation

## 2021-12-14 DIAGNOSIS — I493 Ventricular premature depolarization: Secondary | ICD-10-CM | POA: Insufficient documentation

## 2021-12-14 LAB — ECHOCARDIOGRAM COMPLETE: S' Lateral: 3.2 cm

## 2022-01-30 ENCOUNTER — Telehealth: Payer: Self-pay | Admitting: Internal Medicine

## 2022-01-31 ENCOUNTER — Telehealth: Payer: Self-pay | Admitting: Internal Medicine

## 2022-01-31 NOTE — Telephone Encounter (Signed)
After speaking to cardiology, patient needs to return heart monitor after using it for 14 days. They have also faxed use the echo results.  ? ?Patient has also been informed to return the monitor after using for 14 days, patient understands instructions ?

## 2022-01-31 NOTE — Telephone Encounter (Signed)
Spoke with Nancy Howard who states that he was able to talk with the patient and she has not worn the heart monitor yet. She is waiting for one of her children to help her read the instructions and put it on for her. She reported that she is going to put it on today. Advised that we would follow up with her based on those results. ?

## 2022-01-31 NOTE — Telephone Encounter (Signed)
Nancy Howard from [facility] calling to request the patient's monitor results. He states Dr. Mack Hook is not able to view them. Phone: [phone] ? ?

## 2022-01-31 NOTE — Telephone Encounter (Signed)
I spoke with Nancy Howard at the pts PCP office and I faxed the pts Echo results to them and advised him that we are not sure if she wore it for the 14 days and if she ever sent it back through the mail... he says he will follow up with her and will let us know.  ?

## 2022-01-31 NOTE — Telephone Encounter (Signed)
Nancy Howard is calling back wanting to know if our office has been trying to contact the patient for a f/u and has been unsuccessful. Unsure of when pt is due to be seen. Please advise.  ?

## 2022-05-29 ENCOUNTER — Encounter (HOSPITAL_COMMUNITY): Payer: Self-pay | Admitting: Emergency Medicine

## 2022-05-29 ENCOUNTER — Emergency Department (HOSPITAL_COMMUNITY)
Admission: EM | Admit: 2022-05-29 | Discharge: 2022-05-29 | Disposition: A | Discharge status: Home / Self Care | Payer: Self-pay | Attending: Emergency Medicine | Admitting: Emergency Medicine

## 2022-05-29 ENCOUNTER — Emergency Department (HOSPITAL_COMMUNITY): Payer: Self-pay

## 2022-05-29 DIAGNOSIS — R0789 Other chest pain: Secondary | ICD-10-CM | POA: Insufficient documentation

## 2022-05-29 DIAGNOSIS — F419 Anxiety disorder, unspecified: Secondary | ICD-10-CM | POA: Insufficient documentation

## 2022-05-29 DIAGNOSIS — E876 Hypokalemia: Secondary | ICD-10-CM | POA: Insufficient documentation

## 2022-05-29 DIAGNOSIS — R0602 Shortness of breath: Secondary | ICD-10-CM | POA: Insufficient documentation

## 2022-05-29 LAB — CBC
HCT: 36.6 % (ref 36.0–46.0)
Hemoglobin: 11.1 g/dL — ABNORMAL LOW (ref 12.0–15.0)
MCH: 18.6 pg — ABNORMAL LOW (ref 26.0–34.0)
MCHC: 30.3 g/dL (ref 30.0–36.0)
MCV: 61.2 fL — ABNORMAL LOW (ref 80.0–100.0)
Platelets: 304 10*3/uL (ref 150–400)
RBC: 5.98 MIL/uL — ABNORMAL HIGH (ref 3.87–5.11)
RDW: 17.1 % — ABNORMAL HIGH (ref 11.5–15.5)
WBC: 9 10*3/uL (ref 4.0–10.5)
nRBC: 0 % (ref 0.0–0.2)

## 2022-05-29 LAB — I-STAT BETA HCG BLOOD, ED (MC, WL, AP ONLY): I-stat hCG, quantitative: <5 m[IU]/mL (ref <5)

## 2022-05-29 LAB — BASIC METABOLIC PANEL
Anion gap: 8 (ref 5–15)
BUN: 13 mg/dL (ref 6–20)
CO2: 19 mmol/L — ABNORMAL LOW (ref 22–32)
Calcium: 9.6 mg/dL (ref 8.9–10.3)
Chloride: 111 mmol/L (ref 98–111)
Creatinine, Ser: 0.66 mg/dL (ref 0.44–1.00)
GFR, Estimated: >60 mL/min (ref >60)
Glucose, Bld: 110 mg/dL — ABNORMAL HIGH (ref 70–99)
Potassium: 3.3 mmol/L — ABNORMAL LOW (ref 3.5–5.1)
Sodium: 138 mmol/L (ref 135–145)

## 2022-05-29 LAB — D-DIMER, QUANTITATIVE: D-Dimer, Quant: <0.27 ug/mL-FEU (ref 0.00–0.50)

## 2022-05-29 LAB — TROPONIN I (HIGH SENSITIVITY)
Troponin I (High Sensitivity): 3 ng/L (ref <18)
Troponin I (High Sensitivity): 3 ng/L (ref <18)

## 2022-05-29 MED ORDER — POTASSIUM CHLORIDE CRYS ER 20 MEQ PO TBCR
40.0000 meq | EXTENDED_RELEASE_TABLET | Freq: Once | ORAL | Status: AC
  Administered 2022-05-29: 40 meq via ORAL
  Filled 2022-05-29: qty 2

## 2022-05-29 MED ORDER — LORAZEPAM 1 MG PO TABS
0.5000 mg | ORAL_TABLET | Freq: Once | ORAL | Status: AC
  Administered 2022-05-29: 0.5 mg via ORAL
  Filled 2022-05-29: qty 1

## 2022-05-29 NOTE — ED Triage Notes (Addendum)
Patient here with complaint of shortness of breath that started earlier today. Patient RR 34 on room air with SpO2 100%, placed on 2L O2 [address] and patient reports improvement in shortness of breath. Denies chest pain

## 2022-05-29 NOTE — Discharge Instructions (Signed)
Hoy lo vieron en el departamento de emergencias por su dificultad para respirar. En el departamento de Publishing copy hicieron un estudio que incluy un electrocardiograma, anlisis de laboratorio y [place] radiografa de trax que fue tranquilizador. Seguimiento con un mdico primario en 2-3 das con respecto a su visita. Puede utilizar la clnica que se indica en este paquete para programar una cita. Alguien de la clnica de medicina interna tambin lo llamar para programar una cita. Regrese de inmediato al departamento de emergencias si experimenta cualquiera de los siguientes: dificultad para respirar severa, dolor en el pecho o cualquier otro sntoma preocupante. Gracias por visitar nuestro Departamento de Emergencias. Fue un placer atenderte Nancy Howard.   Today you were seen in the emergency department for your shortness of breath.    In the emergency department you had a workup including EKG, lab work, and chest x-ray that was reassuring.    Follow-up with a primary doctor in 2-3 days regarding your visit. You may use the clinic listed on this packet to make an appointment. Someone from the internal medicine clinic will also be calling you to set up an appointment.   Return immediately to the emergency department if you experience any of the following: severe shortness of breath, chest pain, or any other concerning symptoms.    Thank you for visiting our Emergency Department. It was a pleasure taking care of you today.

## 2022-05-29 NOTE — ED Provider Notes (Signed)
[facility]GENC[facility] Provider Note   CSN: [id number] Arrival date & time: 05/29/22  1328     History  Chief Complaint  Patient presents with   Nancy Howard is a 46 y.o. female.  46 year old female with a history of hyperlipidemia, heart palpitations, and PVCs who presents emergency department with Nancy of breath.  Patient states that she started having sharp right-sided chest discomfort and Nancy of breath last night.  Says that she woke up and it got worse.  Says the pain is nonexertional and there is no diaphoresis or radiation.  Says that she has been anxious recently and feels that that is contributing.  Reports that she has been to the emergency department several times for similar symptoms but they have been unable to figure out what the causes.  Does not have any history of DVT or PE.  No history of MI.  Not on blood thinners.  No history of cancer or recent surgery.    Nancy of Breath      Home Medications Prior to Admission medications   Medication Sig Start Date End Date Taking? Authorizing Provider  cetirizine (ZYRTEC) 10 MG tablet Take 1 tablet (10 mg total) by mouth daily. 03/20/18   Mack Hook, MD  clobetasol cream (TEMOVATE) 2.97 % Apply 1 application topically 2 (two) times daily. 11/08/21   Mack Hook, MD  fluticasone (FLONASE) 50 MCG/ACT nasal spray Place 2 sprays into both nostrils daily.    [provider]      Allergies    Patient has no known allergies.    Review of Systems   Review of Systems  Respiratory:  Positive for Nancy of breath.     Physical Exam Updated Vital Signs BP (!) 134/92   Pulse 73   Temp 98.6 F (37 C) (Oral)   Resp 18   Ht 5' (1.524 m)   Wt 65.8 kg   LMP 03/27/2022 (Within Months)   SpO2 97%   BMI 28.32 kg/m  Physical Exam Vitals and nursing note reviewed.  Constitutional:      General: She is not in acute distress.     Appearance: She is well-developed.     Comments: Appears anxious and short of breath.  On 2 L nasal cannula.  Wean down to RA with out desaturation.  HENT:     Head: Normocephalic and atraumatic.     Right Ear: External ear normal.     Left Ear: External ear normal.     Nose: Nose normal.  Eyes:     Extraocular Movements: Extraocular movements intact.     Conjunctiva/sclera: Conjunctivae normal.     Pupils: Pupils are equal, round, and reactive to light.  Cardiovascular:     Rate and Rhythm: Normal rate and regular rhythm.     Pulses: Normal pulses.     Heart sounds: No murmur heard. Pulmonary:     Effort: Pulmonary effort is normal. No respiratory distress.     Breath sounds: Normal breath sounds.  Abdominal:     General: Abdomen is flat. There is no distension.     Palpations: Abdomen is soft. There is no mass.     Tenderness: There is no abdominal tenderness. There is no guarding.  Musculoskeletal:        General: No swelling.     Cervical back: Normal range of motion and neck supple.     Right lower leg: No edema.  Left lower leg: No edema.  Skin:    General: Skin is warm and dry.     Capillary Refill: Capillary refill takes less than 2 seconds.  Neurological:     Mental Status: She is alert and oriented to person, place, and time. Mental status is at baseline.  Psychiatric:        Mood and Affect: Mood normal.     ED Results / Procedures / Treatments   Labs (all labs ordered are listed, but only abnormal results are displayed) Labs Reviewed  BASIC METABOLIC PANEL - Abnormal; Notable for the following components:      Result Value   Potassium 3.3 (*)    CO2 19 (*)    Glucose, Bld 110 (*)    All other components within normal limits  CBC - Abnormal; Notable for the following components:   RBC 5.98 (*)    Hemoglobin 11.1 (*)    MCV 61.2 (*)    MCH 18.6 (*)    RDW 17.1 (*)    All other components within normal limits  D-DIMER, QUANTITATIVE  I-STAT BETA HCG  BLOOD, ED ([facility], [facility], [facility] ONLY)  TROPONIN I (HIGH SENSITIVITY)  TROPONIN I (HIGH SENSITIVITY)    EKG EKG Interpretation  Date/Time:  Sunday May 29 2022 13:44:19 EDT Ventricular Rate:  79 PR Interval:  146 QRS Duration: 76 QT Interval:  384 QTC Calculation: 440 R Axis:   -13 Text Interpretation: Limited by baseline artifact Normal sinus rhythm Cannot rule out Anterior infarct , age undetermined Abnormal ECG When compared with ECG of 06-Nov-2021 22:06, PREVIOUS ECG IS PRESENT Confirmed by Margaretmary Eddy [phone]) on 05/29/2022 3:13:23 PM  Radiology DG Chest 2 View  Result Date: 05/29/2022 CLINICAL DATA:  Nancy of breath EXAM: CHEST - 2 VIEW COMPARISON:  11/06/2021 FINDINGS: The heart size and mediastinal contours are within normal limits. Both lungs are clear. The visualized skeletal structures are unremarkable. IMPRESSION: No active cardiopulmonary disease. Electronically Signed   By: Elmer Picker M.D.   On: 05/29/2022 15:00    Procedures Procedures   Medications Ordered in ED Medications  LORazepam (ATIVAN) tablet 0.5 mg (0.5 mg Oral Given 05/29/22 1553)  potassium chloride SA (KLOR-CON M) CR tablet 40 mEq (40 mEq Oral Given 05/29/22 1554)    ED Course/ Medical Decision Making/ A&P                           Medical Decision Making Amount and/or Complexity of Data Reviewed Labs: ordered. Radiology: ordered.  Risk Prescription drug management.   46 year old female with a history of hyperlipidemia, heart palpitations, and PVCs who presents emergency department with Nancy of breath.   Initial DDx: MI, PE, pneumothorax, asthma, anxiety  Plan:  Labs Troponin D-dimer Chest x-ray EKG P.o. Ativan  ED Summary:  Patient was monitored in the emergency department and was stable on room air.  She was given oral Ativan for her anxiety which improved her symptoms.  Chest x-ray, EKG, D-dimer, and troponin were all reassuring.  She also had another evaluation for  similar symptoms recently with a reassuring evaluation.  Does not appear to be an organic cause for her symptoms at all as her lungs are clear to auscultation her labs do not show another reason for her to be short of breath such as DKA, severe hypoglycemia, or elevated D-dimer that would be suggestive of pulmonary embolism.  Patient had low potassium that was repleted.  With her reassuring  work-up today she was informed that she should continue her work-up with with her primary doctor as an outpatient.  Suspect that her symptoms are likely due to anxiety.   Additional history obtained from family - daughter Records reviewed Care Everywhere/External Records  I independently visualized the following imaging with scope of interpretation limited to determining acute life threatening conditions related to emergency care: CXR, which revealed no acute abnormality  Final Clinical Impression(s) / ED Diagnoses Final diagnoses:  Nancy of breath  Anxiety    Rx / DC Orders ED Discharge Orders          Ordered    Ambulatory referral to Internal Medicine       Comments: Requesting PCP - Please call [phone]   05/29/22 1839              Fransico Meadow, MD 05/30/22 1053

## 2022-09-19 ENCOUNTER — Emergency Department (HOSPITAL_COMMUNITY): Payer: Self-pay

## 2022-09-19 ENCOUNTER — Other Ambulatory Visit: Payer: Self-pay

## 2022-09-19 ENCOUNTER — Encounter (HOSPITAL_COMMUNITY): Payer: Self-pay

## 2022-09-19 ENCOUNTER — Emergency Department (HOSPITAL_COMMUNITY)
Admission: EM | Admit: 2022-09-19 | Discharge: 2022-09-19 | Disposition: A | Discharge status: Home / Self Care | Payer: Self-pay | Attending: Emergency Medicine | Admitting: Emergency Medicine

## 2022-09-19 DIAGNOSIS — F410 Panic disorder [episodic paroxysmal anxiety] without agoraphobia: Secondary | ICD-10-CM | POA: Insufficient documentation

## 2022-09-19 DIAGNOSIS — R0602 Shortness of breath: Secondary | ICD-10-CM | POA: Insufficient documentation

## 2022-09-19 DIAGNOSIS — R079 Chest pain, unspecified: Secondary | ICD-10-CM

## 2022-09-19 DIAGNOSIS — F419 Anxiety disorder, unspecified: Secondary | ICD-10-CM | POA: Insufficient documentation

## 2022-09-19 DIAGNOSIS — R0789 Other chest pain: Secondary | ICD-10-CM | POA: Insufficient documentation

## 2022-09-19 LAB — I-STAT BETA HCG BLOOD, ED (MC, WL, AP ONLY): I-stat hCG, quantitative: <5 m[IU]/mL (ref <5)

## 2022-09-19 LAB — BASIC METABOLIC PANEL
Anion gap: 9 (ref 5–15)
BUN: 11 mg/dL (ref 6–20)
CO2: 20 mmol/L — ABNORMAL LOW (ref 22–32)
Calcium: 9.5 mg/dL (ref 8.9–10.3)
Chloride: 106 mmol/L (ref 98–111)
Creatinine, Ser: 0.59 mg/dL (ref 0.44–1.00)
GFR, Estimated: >60 mL/min (ref >60)
Glucose, Bld: 116 mg/dL — ABNORMAL HIGH (ref 70–99)
Potassium: 3.1 mmol/L — ABNORMAL LOW (ref 3.5–5.1)
Sodium: 135 mmol/L (ref 135–145)

## 2022-09-19 LAB — CBC
HCT: 38.7 % (ref 36.0–46.0)
Hemoglobin: 11.9 g/dL — ABNORMAL LOW (ref 12.0–15.0)
MCH: 18.4 pg — ABNORMAL LOW (ref 26.0–34.0)
MCHC: 30.7 g/dL (ref 30.0–36.0)
MCV: 60 fL — ABNORMAL LOW (ref 80.0–100.0)
Platelets: 288 10*3/uL (ref 150–400)
RBC: 6.45 MIL/uL — ABNORMAL HIGH (ref 3.87–5.11)
RDW: 17.2 % — ABNORMAL HIGH (ref 11.5–15.5)
WBC: 10.3 10*3/uL (ref 4.0–10.5)
nRBC: 0 % (ref 0.0–0.2)

## 2022-09-19 LAB — TROPONIN I (HIGH SENSITIVITY)
Troponin I (High Sensitivity): <2 ng/L (ref <18)
Troponin I (High Sensitivity): 3 ng/L (ref <18)

## 2022-09-19 MED ORDER — HYDROXYZINE HCL 25 MG PO TABS: 25.0000 mg | ORAL_TABLET | Freq: Four times a day (QID) | ORAL | 0 refills | Status: DC

## 2022-09-19 MED ORDER — HYDROXYZINE HCL 10 MG PO TABS
10.0000 mg | ORAL_TABLET | Freq: Once | ORAL | Status: AC
  Administered 2022-09-19: 10 mg via ORAL
  Filled 2022-09-19: qty 1

## 2022-09-19 NOTE — ED Provider Notes (Signed)
[facility] EMERGENCY DEPARTMENT Provider Note   CSN: [id number] Arrival date & time: 09/19/22  0143     History  Chief Complaint  Patient presents with   Chest Pain    Nancy Howard is a 46 y.o. female.  Patient here with chest discomfort, anxiousness, shortness of breath.  Panic-like attack per family member.  She had the same recently.  She denies any suicidal homicidal ideation.  She denies any stressors.  Not having any active symptoms.  Things have resolved.  Nothing makes it worse or better.  No pain with eating.  No abdominal pain, nausea, vomiting.  Denies any fevers or chills.  No significant medical history.  No recent surgery or travel.  The history is provided by the patient.       Home Medications Prior to Admission medications   Medication Sig Start Date End Date Taking? Authorizing Provider  hydrOXYzine (ATARAX) 25 MG tablet Take 1 tablet (25 mg total) by mouth every 6 (six) hours. 09/19/22  Yes Lathaniel Legate, DO  cetirizine (ZYRTEC) 10 MG tablet Take 1 tablet (10 mg total) by mouth daily. 03/20/18   Mack Hook, MD  clobetasol cream (TEMOVATE) 9.83 % Apply 1 application topically 2 (two) times daily. 11/08/21   Mack Hook, MD  fluticasone (FLONASE) 50 MCG/ACT nasal spray Place 2 sprays into both nostrils daily.    [provider]      Allergies    Patient has no known allergies.    Review of Systems   Review of Systems  Physical Exam Updated Vital Signs BP (!) 126/93 (BP Location: Right Arm)   Pulse 76   Temp 97.9 F (36.6 C) (Oral)   Resp 18   SpO2 99%  Physical Exam Vitals and nursing note reviewed.  Constitutional:      General: She is not in acute distress.    Appearance: She is well-developed.  HENT:     Head: Normocephalic and atraumatic.  Eyes:     Extraocular Movements: Extraocular movements intact.     Conjunctiva/sclera: Conjunctivae normal.     Pupils: Pupils are equal, round, and  reactive to light.  Cardiovascular:     Rate and Rhythm: Normal rate and regular rhythm.     Heart sounds: Normal heart sounds. No murmur heard. Pulmonary:     Effort: Pulmonary effort is normal. No respiratory distress.     Breath sounds: Normal breath sounds. No decreased breath sounds or wheezing.  Abdominal:     Palpations: Abdomen is soft.     Tenderness: There is no abdominal tenderness.  Musculoskeletal:        General: No swelling.     Cervical back: Normal range of motion and neck supple.  Skin:    General: Skin is warm and dry.     Capillary Refill: Capillary refill takes less than 2 seconds.  Neurological:     General: No focal deficit present.     Mental Status: She is alert.  Psychiatric:        Mood and Affect: Mood normal. Mood is not anxious.        Behavior: Behavior normal.     ED Results / Procedures / Treatments   Labs (all labs ordered are listed, but only abnormal results are displayed) Labs Reviewed  BASIC METABOLIC PANEL - Abnormal; Notable for the following components:      Result Value   Potassium 3.1 (*)    CO2 20 (*)    Glucose,  Bld 116 (*)    All other components within normal limits  CBC - Abnormal; Notable for the following components:   RBC 6.45 (*)    Hemoglobin 11.9 (*)    MCV 60.0 (*)    MCH 18.4 (*)    RDW 17.2 (*)    All other components within normal limits  I-STAT BETA HCG BLOOD, ED ([facility], [facility], [facility] ONLY)  TROPONIN I (HIGH SENSITIVITY)  TROPONIN I (HIGH SENSITIVITY)    EKG EKG Interpretation  Date/Time:  Monday September 19 2022 01:38:52 EST Ventricular Rate:  82 PR Interval:  144 QRS Duration: 86 QT Interval:  404 QTC Calculation: 472 R Axis:   -32 Text Interpretation: Normal sinus rhythm Confirmed by Lennice Sites ([id number]) on 09/19/2022 8:32:48 AM  Radiology DG Chest 2 View  Result Date: 09/19/2022 CLINICAL DATA:  Chest pain.  Syncopal episode. EXAM: CHEST - 2 VIEW COMPARISON:  AP Lat chest 05/29/2022 FINDINGS: The heart  size and mediastinal contours are within normal limits. Both lungs are hypoinflated but clear. The visualized skeletal structures are unremarkable. Old cholecystectomy. IMPRESSION: No active cardiopulmonary disease.  Hypoinflated exam. Electronically Signed   By: Telford Nab M.D.   On: 09/19/2022 02:17    Procedures Procedures    Medications Ordered in ED Medications  hydrOXYzine (ATARAX) tablet 10 mg (has no administration in time range)    ED Course/ Medical Decision Making/ A&P                           Medical Decision Making Amount and/or Complexity of Data Reviewed Labs: ordered. Radiology: ordered.  Risk Prescription drug management.   Arah Aro is here with chest pain, shortness of breath, anxiousness.  Normal vitals.  No fever.  EKG per my review and interpretation shows sinus rhythm.  No ischemic changes.  Unchanged from prior EKGs.  Differential diagnosis likely panic attack versus less likely ACS.  She is PERC negative and doubt PE.  She has no infectious symptoms and doubt pneumonia.  Will check CBC, BMP, troponin, chest x-ray to evaluate.  Will give Vistaril for anxiety.  Per my review and interpretation of labs with no significant anemia, electrolyte abnormality or kidney injury or leukocytosis.  Troponin negative x 2.  Heart score is 0 and doubt ACS.  Chest x-ray shows no evidence of pneumonia or pneumothorax.  Overall suspect this is anxiety related.  Visit in the past for the same.  Will prescribe her Vistaril.  Will have her follow-up with wellness center and will provide her information to follow-up with behavioral health as well.  Discharged in good condition.  Understands return precautions.  This chart was dictated using voice recognition software.  Despite best efforts to proofread,  errors can occur which can change the documentation meaning.         Final Clinical Impression(s) / ED Diagnoses Final diagnoses:  Nonspecific chest pain    Rx  / DC Orders ED Discharge Orders          Ordered    hydrOXYzine (ATARAX) 25 MG tablet  Every 6 hours        09/19/22 0848              Lennice Sites, DO 09/19/22 0848

## 2022-09-19 NOTE — ED Triage Notes (Signed)
Pt states that she has been having CP and SOB for the past day, pt also had a syncopal episode

## 2022-09-30 ENCOUNTER — Ambulatory Visit (INDEPENDENT_AMBULATORY_CARE_PROVIDER_SITE_OTHER): Payer: Self-pay | Admitting: Primary Care

## 2023-01-20 ENCOUNTER — Ambulatory Visit: Payer: Self-pay | Admitting: Family Medicine

## 2023-02-15 ENCOUNTER — Encounter: Payer: Self-pay | Admitting: Family Medicine

## 2023-02-15 ENCOUNTER — Ambulatory Visit: Payer: Self-pay | Admitting: Family Medicine

## 2023-02-15 VITALS — BP 118/76 | HR 65 | Ht 60.0 in | Wt 151.0 lb

## 2023-02-15 DIAGNOSIS — F411 Generalized anxiety disorder: Secondary | ICD-10-CM

## 2023-02-15 MED ORDER — HYDROXYZINE HCL 25 MG PO TABS: 25.0000 mg | ORAL_TABLET | Freq: Three times a day (TID) | ORAL | 0 refills | Status: DC | PRN

## 2023-02-15 MED ORDER — ESCITALOPRAM OXALATE 10 MG PO TABS: 10.0000 mg | ORAL_TABLET | Freq: Every day | ORAL | 2 refills | Status: DC

## 2023-02-15 NOTE — Assessment & Plan Note (Signed)
47 year old female presents to establish care.  It sounds like she is having episodes of panic attacks and anxiety.  This is supported by the fact that hydroxyzine does help.  We discussed that we want to get ahead of all of the symptoms and start her on a daily controller medication.  The best 1 for her would be Lexapro 10 mg at this time.  I have also given her hydroxyzine to use as needed for panic attacks. - Patient will follow-up in 1 month to see if this has been efficacious.

## 2023-02-15 NOTE — Progress Notes (Signed)
Established patient visit   Patient: Nancy Howard   DOB: 04/15/1976   46 y.o. Female  MRN: [id number] Visit Date: 02/15/2023  Today's healthcare provider: Charlton Amor, DO   Chief Complaint  Patient presents with   Establish Care    SUBJECTIVE    Chief Complaint  Patient presents with   Establish Care   HPI   Patient is here today with her daughter who helps the mom explain what is going on.  Patient has been having episodes of shortness of breath and chest pain that have been periodically.  She was seen in the ER multiple times for this.  At the last visit she was given hydroxyzine which seem to help.   Review of Systems  Constitutional:  Negative for activity change, fatigue and fever.  Respiratory:  Negative for cough and shortness of breath.   Cardiovascular:  Negative for chest pain.  Gastrointestinal:  Negative for abdominal pain.  Genitourinary:  Negative for difficulty urinating.       Current Meds  Medication Sig   cetirizine (ZYRTEC) 10 MG tablet Take 1 tablet (10 mg total) by mouth daily.   clobetasol cream (TEMOVATE) 0.05 % Apply 1 application topically 2 (two) times daily.   escitalopram (LEXAPRO) 10 MG tablet Take 1 tablet (10 mg total) by mouth at bedtime.   fluticasone (FLONASE) 50 MCG/ACT nasal spray Place 2 sprays into both nostrils daily.   [DISCONTINUED] hydrOXYzine (ATARAX) 25 MG tablet Take 1 tablet (25 mg total) by mouth every 6 (six) hours.    OBJECTIVE    BP 118/76   Pulse 65   Ht 5' (1.524 m)   Wt 151 lb (68.5 kg)   SpO2 98%   BMI 29.49 kg/m   Physical Exam Vitals and nursing note reviewed.  Constitutional:      General: She is not in acute distress.    Appearance: Normal appearance.  HENT:     Head: Normocephalic and atraumatic.     Right Ear: External ear normal.     Left Ear: External ear normal.     Nose: Nose normal.  Eyes:     Conjunctiva/sclera: Conjunctivae normal.  Cardiovascular:     Rate and Rhythm:  Normal rate and regular rhythm.  Pulmonary:     Effort: Pulmonary effort is normal.     Breath sounds: Normal breath sounds.  Neurological:     General: No focal deficit present.     Mental Status: She is alert and oriented to person, place, and time.  Psychiatric:        Mood and Affect: Mood normal.        Behavior: Behavior normal.        Thought Content: Thought content normal.        Judgment: Judgment normal.         ASSESSMENT & PLAN    Problem List Items Addressed This Visit       Other   GAD (generalized anxiety disorder) - Primary    47 year old female presents to establish care.  It sounds like she is having episodes of panic attacks and anxiety.  This is supported by the fact that hydroxyzine does help.  We discussed that we want to get ahead of all of the symptoms and start her on a daily controller medication.  The best 1 for her would be Lexapro 10 mg at this time.  I have also given her hydroxyzine to use as needed for panic  attacks. - Patient will follow-up in 1 month to see if this has been efficacious.      Relevant Medications   escitalopram (LEXAPRO) 10 MG tablet   hydrOXYzine (ATARAX) 25 MG tablet    Return in about 4 weeks (around 03/15/2023).      Meds ordered this encounter  Medications   escitalopram (LEXAPRO) 10 MG tablet    Sig: Take 1 tablet (10 mg total) by mouth at bedtime.    Dispense:  30 tablet    Refill:  2   hydrOXYzine (ATARAX) 25 MG tablet    Sig: Take 1 tablet (25 mg total) by mouth every 8 (eight) hours as needed for anxiety.    Dispense:  30 tablet    Refill:  0    No orders of the defined types were placed in this encounter.    Charlton Amor, DO  [facility] Medicine at [facility] [phone] (phone) [phone] (fax)  [facility]

## 2023-03-10 ENCOUNTER — Ambulatory Visit: Payer: Self-pay | Admitting: Family Medicine

## 2023-03-23 NOTE — Progress Notes (Signed)
No show

## 2023-03-24 ENCOUNTER — Ambulatory Visit (INDEPENDENT_AMBULATORY_CARE_PROVIDER_SITE_OTHER): Payer: Self-pay | Admitting: Family Medicine

## 2023-03-24 DIAGNOSIS — Z91199 Patient's noncompliance with other medical treatment and regimen due to unspecified reason: Secondary | ICD-10-CM

## 2023-05-22 ENCOUNTER — Telehealth: Payer: Self-pay | Admitting: Family Medicine

## 2023-05-22 NOTE — Telephone Encounter (Signed)
Patient's daughter called in stating that her mother is vomiting when she takes her PRN medication, Atarax. States she would like a different medication. Please advise.  [facility]  [address]

## 2023-05-24 NOTE — Telephone Encounter (Signed)
LM for pts daughter to return call. Roselyn Reef, CMA

## 2023-06-02 ENCOUNTER — Encounter: Payer: Self-pay | Admitting: Family Medicine

## 2023-06-02 ENCOUNTER — Ambulatory Visit (INDEPENDENT_AMBULATORY_CARE_PROVIDER_SITE_OTHER): Payer: Self-pay | Admitting: Family Medicine

## 2023-06-02 VITALS — BP 128/86 | HR 62 | Resp 20 | Ht 60.0 in | Wt 152.5 lb

## 2023-06-02 DIAGNOSIS — J302 Other seasonal allergic rhinitis: Secondary | ICD-10-CM

## 2023-06-02 DIAGNOSIS — Z9109 Other allergy status, other than to drugs and biological substances: Secondary | ICD-10-CM | POA: Insufficient documentation

## 2023-06-02 DIAGNOSIS — F411 Generalized anxiety disorder: Secondary | ICD-10-CM

## 2023-06-02 DIAGNOSIS — M62838 Other muscle spasm: Secondary | ICD-10-CM | POA: Insufficient documentation

## 2023-06-02 MED ORDER — CYCLOBENZAPRINE HCL 5 MG PO TABS: 5.0000 mg | ORAL_TABLET | Freq: Every day | ORAL | 1 refills | Status: DC

## 2023-06-02 MED ORDER — MELOXICAM 15 MG PO TABS: 15.0000 mg | ORAL_TABLET | Freq: Every day | ORAL | 0 refills | Status: DC

## 2023-06-02 MED ORDER — CETIRIZINE HCL 10 MG PO TABS: 10.0000 mg | ORAL_TABLET | Freq: Every day | ORAL | 3 refills | Status: DC

## 2023-06-02 MED ORDER — HYDROXYZINE HCL 25 MG PO TABS: 25.0000 mg | ORAL_TABLET | Freq: Three times a day (TID) | ORAL | 2 refills | Status: DC | PRN

## 2023-06-02 NOTE — Assessment & Plan Note (Addendum)
Patient presents with right-sided back pain based on physical exam it is likely that this is a muscle spasm.  Patient does a lot of bending twisting and pulling throughout the day which sounds like it exacerbated that she has also noticed improvement in the last week or so which leads me to believe this is definitely musculoskeletal.  We will go ahead and do a nightly muscle relaxer.  Patient is also having trouble sleeping so we will do Flexeril that will make her a little drowsy.  I did instruct patient she is not allowed to drive while taking this medicine.  I will also go ahead and do meloxicam daily to help with inflammation.  Have also provided patient with back exercises.  Also recommended heating pad.  Did discuss that if home exercises do not work we can put a referral in for physical therapy.

## 2023-06-02 NOTE — Progress Notes (Signed)
Established patient visit   Patient: Nancy Howard   DOB: [birth year]   47 y.o. Female  MRN: [id number] Visit Date: 06/02/2023  Today's healthcare provider: Charlton Amor, DO   Chief Complaint  Patient presents with   Back Pain    X2 wks doesn't recall an injury    SUBJECTIVE    Chief Complaint  Patient presents with   Back Pain    X2 wks doesn't recall an injury   Back Pain Pertinent negatives include no abdominal pain, chest pain or fever.   Patient presents with low back pain for about 2 weeks.  Over the last week it has gotten better.  Daughter is present to help with interpretation.  Denies any radiation of symptoms or loss of bowel or bladder function.   Review of Systems  Constitutional:  Negative for activity change, fatigue and fever.  Respiratory:  Negative for cough and shortness of breath.   Cardiovascular:  Negative for chest pain.  Gastrointestinal:  Negative for abdominal pain.  Genitourinary:  Negative for difficulty urinating.  Musculoskeletal:  Positive for back pain.       Current Meds  Medication Sig   cyclobenzaprine (FLEXERIL) 5 MG tablet Take 1 tablet (5 mg total) by mouth at bedtime.   meloxicam (MOBIC) 15 MG tablet Take 1 tablet (15 mg total) by mouth daily.    OBJECTIVE    BP 128/86 (BP Location: Left Arm, Patient Position: Sitting, Cuff Size: Normal)   Pulse 62   Resp 20   Ht 5' (1.524 m)   Wt 152 lb 8 oz (69.2 kg)   SpO2 100%   BMI 29.78 kg/m   Physical Exam Vitals and nursing note reviewed.  Constitutional:      General: She is not in acute distress.    Appearance: Normal appearance.  HENT:     Head: Normocephalic and atraumatic.     Right Ear: External ear normal.     Left Ear: External ear normal.     Nose: Nose normal.  Eyes:     Conjunctiva/sclera: Conjunctivae normal.  Cardiovascular:     Rate and Rhythm: Normal rate and regular rhythm.  Pulmonary:     Effort: Pulmonary effort is normal.     Breath  sounds: Normal breath sounds.  Musculoskeletal:     Comments: Tenderness to palpation of right trapezius muscle and right low back in the paraspinal area.  Neurological:     General: No focal deficit present.     Mental Status: She is alert and oriented to person, place, and time.  Psychiatric:        Mood and Affect: Mood normal.        Behavior: Behavior normal.        Thought Content: Thought content normal.        Judgment: Judgment normal.        ASSESSMENT & PLAN    Problem List Items Addressed This Visit       Other   Seasonal allergies    Patient admits to her nose running and her allergies getting worse.  Have sent in a prescription of Zyrtec.      Relevant Medications   cetirizine (ZYRTEC) 10 MG tablet   GAD (generalized anxiety disorder)    Patient has side effects to Lexapro that made her nauseous and throw up.  She did say that her anxiety was controlled with hydroxyzine as needed.  Will go ahead and do a refill for  her to take this as needed.      Relevant Medications   hydrOXYzine (ATARAX) 25 MG tablet   Muscle spasm - Primary    Patient presents with right-sided back pain based on physical exam it is likely that this is a muscle spasm.  Patient does a lot of bending twisting and pulling throughout the day which sounds like it exacerbated that she has also noticed improvement in the last week or so which leads me to believe this is definitely musculoskeletal.  We will go ahead and do a nightly muscle relaxer.  Patient is also having trouble sleeping so we will do Flexeril that will make her a little drowsy.  I did instruct patient she is not allowed to drive while taking this medicine.  I will also go ahead and do meloxicam daily to help with inflammation.  Have also provided patient with back exercises.  Also recommended heating pad.  Did discuss that if home exercises do not work we can put a referral in for physical therapy.      Relevant Medications    meloxicam (MOBIC) 15 MG tablet   cyclobenzaprine (FLEXERIL) 5 MG tablet    Return in about 3 months (around 09/02/2023) for physical with pap.      Meds ordered this encounter  Medications   cetirizine (ZYRTEC) 10 MG tablet    Sig: Take 1 tablet (10 mg total) by mouth daily.    Dispense:  90 tablet    Refill:  3   hydrOXYzine (ATARAX) 25 MG tablet    Sig: Take 1 tablet (25 mg total) by mouth every 8 (eight) hours as needed for anxiety.    Dispense:  30 tablet    Refill:  2   meloxicam (MOBIC) 15 MG tablet    Sig: Take 1 tablet (15 mg total) by mouth daily.    Dispense:  30 tablet    Refill:  0   cyclobenzaprine (FLEXERIL) 5 MG tablet    Sig: Take 1 tablet (5 mg total) by mouth at bedtime.    Dispense:  30 tablet    Refill:  1    No orders of the defined types were placed in this encounter.    Charlton Amor, DO  [facility] Medicine at [facility] [phone] (phone) [phone] (fax)  [facility]

## 2023-06-02 NOTE — Patient Instructions (Addendum)
Take meloxicam daily as needed for pain--this is similar to advil   Take flexiril nightly for muscle spasm (can take an extra during the day just know you will be sleepy)  Zyrtec take daily for your allergies   Stretch daily

## 2023-06-02 NOTE — Assessment & Plan Note (Signed)
Patient has side effects to Lexapro that made her nauseous and throw up.  She did say that her anxiety was controlled with hydroxyzine as needed.  Will go ahead and do a refill for her to take this as needed.

## 2023-06-02 NOTE — Assessment & Plan Note (Signed)
Patient admits to her nose running and her allergies getting worse.  Have sent in a prescription of Zyrtec.

## 2023-08-11 ENCOUNTER — Ambulatory Visit: Payer: Self-pay | Admitting: Family Medicine

## 2023-08-11 ENCOUNTER — Encounter: Payer: Self-pay | Admitting: Family Medicine

## 2023-08-11 VITALS — BP 126/84 | HR 60 | Ht 60.0 in | Wt 152.2 lb

## 2023-08-11 DIAGNOSIS — G43809 Other migraine, not intractable, without status migrainosus: Secondary | ICD-10-CM

## 2023-08-11 DIAGNOSIS — R112 Nausea with vomiting, unspecified: Secondary | ICD-10-CM

## 2023-08-11 DIAGNOSIS — G43909 Migraine, unspecified, not intractable, without status migrainosus: Secondary | ICD-10-CM | POA: Insufficient documentation

## 2023-08-11 DIAGNOSIS — Z23 Encounter for immunization: Secondary | ICD-10-CM

## 2023-08-11 MED ORDER — SUMATRIPTAN SUCCINATE 25 MG PO TABS: 25.0000 mg | ORAL_TABLET | ORAL | 0 refills | Status: DC | PRN

## 2023-08-11 MED ORDER — ONDANSETRON HCL 4 MG PO TABS: 4.0000 mg | ORAL_TABLET | Freq: Three times a day (TID) | ORAL | 0 refills | Status: DC | PRN

## 2023-08-11 NOTE — Assessment & Plan Note (Signed)
Based on history it sounds like patient is having migraines. Will go ahead and trial imitrex and I explained to mom and daughter how to use medication - also discussed taking zofran for n/v - told mom to keep track of headaches and if >15 month will need daily medication

## 2023-08-11 NOTE — Progress Notes (Signed)
Acute Office Visit  Subjective:     Patient ID: Nancy Howard, female    DOB: December 28, 1975, 47 y.o.   MRN: [id number]  Chief Complaint  Patient presents with   Headache   Emesis    Pt states she has headaches so bad to the point it makes her vomit. Has been going awhile but is intermittent.     HPI Patient is in today for headaches. Says they are associated with n/v. Denies photophobia and phonophobia. Notes pain usually lasts for 2-4 days. OTC meds don't work. Does not have a headache today.  Review of Systems  Constitutional:  Negative for chills and fever.  Respiratory:  Negative for cough and shortness of breath.   Cardiovascular:  Negative for chest pain.  Neurological:  Negative for headaches.        Objective:    BP 126/84 (BP Location: Left Arm, Patient Position: Sitting, Cuff Size: Normal)   Pulse 60   Ht 5' (1.524 m)   Wt 152 lb 4 oz (69.1 kg)   SpO2 98%   BMI 29.73 kg/m    Physical Exam Vitals and nursing note reviewed.  Constitutional:      General: She is not in acute distress.    Appearance: Normal appearance.  HENT:     Head: Normocephalic and atraumatic.     Right Ear: External ear normal.     Left Ear: External ear normal.     Nose: Nose normal.  Eyes:     Conjunctiva/sclera: Conjunctivae normal.  Cardiovascular:     Rate and Rhythm: Normal rate and regular rhythm.  Pulmonary:     Effort: Pulmonary effort is normal.     Breath sounds: Normal breath sounds.  Neurological:     General: No focal deficit present.     Mental Status: She is alert and oriented to person, place, and time.     Comments: CN II-XII in tact  Psychiatric:        Mood and Affect: Mood normal.        Behavior: Behavior normal.        Thought Content: Thought content normal.        Judgment: Judgment normal.     No results found for any visits on 08/11/23.      Assessment & Plan:   Problem List Items Addressed This Visit       Cardiovascular and  Mediastinum   Migraine - Primary    Based on history it sounds like patient is having migraines. Will go ahead and trial imitrex and I explained to mom and daughter how to use medication - also discussed taking zofran for n/v - told mom to keep track of headaches and if >15 month will need daily medication      Relevant Medications   SUMAtriptan (IMITREX) 25 MG tablet   Other Visit Diagnoses     Nausea and vomiting, unspecified vomiting type       Relevant Medications   ondansetron (ZOFRAN) 4 MG tablet   Encounter for immunization       Relevant Orders   Flu vaccine trivalent PF, 6mos and older(Flulaval,Afluria,Fluarix,Fluzone) (Completed)       Meds ordered this encounter  Medications   SUMAtriptan (IMITREX) 25 MG tablet    Sig: Take 1 tablet (25 mg total) by mouth every 2 (two) hours as needed for migraine. May repeat in 2 hours if headache persists or recurs. Max of two doses in 24 hours  Dispense:  20 tablet    Refill:  0   ondansetron (ZOFRAN) 4 MG tablet    Sig: Take 1 tablet (4 mg total) by mouth every 8 (eight) hours as needed for nausea or vomiting.    Dispense:  20 tablet    Refill:  0    Return in about 3 months (around 11/11/2023) for migraine follow up.  Charlton Amor, DO

## 2023-11-10 ENCOUNTER — Encounter: Payer: Self-pay | Admitting: Family Medicine

## 2023-11-17 ENCOUNTER — Ambulatory Visit: Payer: Self-pay

## 2023-11-17 ENCOUNTER — Encounter: Payer: Self-pay | Admitting: Family Medicine

## 2023-11-17 ENCOUNTER — Ambulatory Visit (INDEPENDENT_AMBULATORY_CARE_PROVIDER_SITE_OTHER): Payer: Self-pay | Admitting: Family Medicine

## 2023-11-17 VITALS — BP 139/86 | HR 63 | Ht 60.0 in | Wt 151.6 lb

## 2023-11-17 DIAGNOSIS — R2232 Localized swelling, mass and lump, left upper limb: Secondary | ICD-10-CM

## 2023-11-17 DIAGNOSIS — G43009 Migraine without aura, not intractable, without status migrainosus: Secondary | ICD-10-CM

## 2023-11-17 DIAGNOSIS — Z1211 Encounter for screening for malignant neoplasm of colon: Secondary | ICD-10-CM

## 2023-11-17 DIAGNOSIS — R109 Unspecified abdominal pain: Secondary | ICD-10-CM

## 2023-11-17 DIAGNOSIS — R1011 Right upper quadrant pain: Secondary | ICD-10-CM | POA: Insufficient documentation

## 2023-11-17 DIAGNOSIS — R1013 Epigastric pain: Secondary | ICD-10-CM | POA: Insufficient documentation

## 2023-11-17 MED ORDER — OMEPRAZOLE 20 MG PO CPDR: 20.0000 mg | DELAYED_RELEASE_CAPSULE | Freq: Every day | ORAL | 0 refills | Status: DC

## 2023-11-17 NOTE — Progress Notes (Signed)
Established patient visit   Patient: Nancy Howard   DOB: 04/28/1976   48 y.o. Female  MRN: [id number] Visit Date: 11/17/2023  Today's healthcare provider: Charlton Amor, DO   Chief Complaint  Patient presents with   Medical Management of Chronic Issues    Migraine and abdominal pain    SUBJECTIVE    Chief Complaint  Patient presents with   Medical Management of Chronic Issues    Migraine and abdominal pain   HPI HPI     Medical Management of Chronic Issues    Additional comments: Migraine and abdominal pain      Last edited by Roselyn Reef, CMA on 11/17/2023  9:30 AM.      Pt notes improvement in migraines with imitrex usage.   Pt also has concerns of abdominal pain. She was given zofran at last visit.   Also has a concern of a mass on her left arm.  Review of Systems  Constitutional:  Negative for activity change, fatigue and fever.  Respiratory:  Negative for cough and shortness of breath.   Cardiovascular:  Negative for chest pain.  Gastrointestinal:  Positive for abdominal pain.  Genitourinary:  Negative for difficulty urinating.       Current Meds  Medication Sig   cetirizine (ZYRTEC) 10 MG tablet Take 1 tablet (10 mg total) by mouth daily.   clobetasol cream (TEMOVATE) 0.05 % Apply 1 application topically 2 (two) times daily.   cyclobenzaprine (FLEXERIL) 5 MG tablet Take 1 tablet (5 mg total) by mouth at bedtime.   fluticasone (FLONASE) 50 MCG/ACT nasal spray Place 2 sprays into both nostrils daily.   hydrOXYzine (ATARAX) 25 MG tablet Take 1 tablet (25 mg total) by mouth every 8 (eight) hours as needed for anxiety.   meloxicam (MOBIC) 15 MG tablet Take 1 tablet (15 mg total) by mouth daily.   omeprazole (PRILOSEC) 20 MG capsule Take 1 capsule (20 mg total) by mouth daily.   ondansetron (ZOFRAN) 4 MG tablet Take 1 tablet (4 mg total) by mouth every 8 (eight) hours as needed for nausea or vomiting.   SUMAtriptan (IMITREX) 25 MG tablet Take 1  tablet (25 mg total) by mouth every 2 (two) hours as needed for migraine. May repeat in 2 hours if headache persists or recurs. Max of two doses in 24 hours    OBJECTIVE    BP 139/86 (BP Location: Left Arm, Patient Position: Sitting, Cuff Size: Large)   Pulse 63   Ht 5' (1.524 m)   Wt 151 lb 9 oz (68.7 kg)   SpO2 99%   BMI 29.60 kg/m   Physical Exam Vitals and nursing note reviewed.  Constitutional:      General: She is not in acute distress.    Appearance: Normal appearance.  HENT:     Head: Normocephalic and atraumatic.     Right Ear: External ear normal.     Left Ear: External ear normal.     Nose: Nose normal.  Eyes:     Conjunctiva/sclera: Conjunctivae normal.  Cardiovascular:     Rate and Rhythm: Normal rate and regular rhythm.  Pulmonary:     Effort: Pulmonary effort is normal.     Breath sounds: Normal breath sounds.  Neurological:     General: No focal deficit present.     Mental Status: She is alert and oriented to person, place, and time.  Psychiatric:        Mood and Affect: Mood normal.  Behavior: Behavior normal.        Thought Content: Thought content normal.        Judgment: Judgment normal.        ASSESSMENT & PLAN    Problem List Items Addressed This Visit       Cardiovascular and Mediastinum   Migraine   Improving. She is having about 1-3 per month - did remind pt if it worsens and she has more than 15/mth we need to get her on prophylactic medication        Other   RUQ pain - Primary   Pt says her pain feels similar to when she had gallbladder disease. Negative Murphy's on exam however will go ahead and order RUQ [place] to eval for gallbladder pathology       Relevant Orders   US Abdomen Limited RUQ (LIVER/GB)   Epigastric pain   - one exam pt has tenderness to epigastric region will do a trial of omeprazole to see if this helps; would help rule out gastric ulcer - if RUQ [place] and ppi do not improve symptoms will need to ref to GI  for further eval.       Relevant Medications   omeprazole (PRILOSEC) 20 MG capsule   Arm mass, left   Pt has left cyst like lesion that is hyperpigmented on posterior shoulder. Pt is worried about this and feels like it has changed. I did order an ultrasound to evaluate to see if this is an epidermoid cyst      Relevant Orders   [place] LT UPPER EXTREM LTD SOFT TISSUE NON VASCULAR   Other Visit Diagnoses       Screening for colon cancer       Relevant Orders   Ambulatory referral to Gastroenterology       No follow-ups on file.      Meds ordered this encounter  Medications   omeprazole (PRILOSEC) 20 MG capsule    Sig: Take 1 capsule (20 mg total) by mouth daily.    Dispense:  30 capsule    Refill:  0    Orders Placed This Encounter  Procedures   US Abdomen Limited RUQ (LIVER/GB)    Standing Status:   Future    Number of Occurrences:   1    Expiration Date:   11/16/2024    Reason for Exam (SYMPTOM  OR DIAGNOSIS REQUIRED):   RUQ pain, has hx of gallstones    Preferred imaging location?:   [facility]   [place] LT UPPER EXTREM LTD SOFT TISSUE NON VASCULAR    Standing Status:   Future    Number of Occurrences:   1    Expiration Date:   11/16/2024    Reason for Exam (SYMPTOM  OR DIAGNOSIS REQUIRED):   small cyst like lesion on left posteriro chest/ shoulder    Preferred imaging location?:   [facility]   Ambulatory referral to Gastroenterology    Referral Priority:   Routine    Referral Type:   Consultation    Referral Reason:   Specialty Services Required    Number of Visits Requested:   1     Charlton Amor, DO  [facility] at [facility] [phone] (phone) [phone] (fax)  [facility]

## 2023-11-17 NOTE — Assessment & Plan Note (Signed)
Pt has left cyst like lesion that is hyperpigmented on posterior shoulder. Pt is worried about this and feels like it has changed. I did order an ultrasound to evaluate to see if this is an epidermoid cyst

## 2023-11-17 NOTE — Assessment & Plan Note (Signed)
Improving. She is having about 1-3 per month - did remind pt if it worsens and she has more than 15/mth we need to get her on prophylactic medication

## 2023-11-17 NOTE — Assessment & Plan Note (Addendum)
Pt says her pain feels similar to when she had gallbladder disease. Negative Murphy's on exam however will go ahead and order RUQ [place] to eval for gallbladder pathology

## 2023-11-17 NOTE — Assessment & Plan Note (Addendum)
-   one exam pt has tenderness to epigastric region will do a trial of omeprazole to see if this helps; would help rule out gastric ulcer - if RUQ [place] and ppi do not improve symptoms will need to ref to GI for further eval.

## 2023-11-30 ENCOUNTER — Other Ambulatory Visit: Payer: Self-pay

## 2023-12-01 ENCOUNTER — Other Ambulatory Visit (HOSPITAL_COMMUNITY)
Admission: RE | Admit: 2023-12-01 | Discharge: 2023-12-01 | Disposition: A | Discharge status: Home / Self Care | Payer: Self-pay | Source: Ambulatory Visit | Attending: Family Medicine | Admitting: Family Medicine

## 2023-12-01 ENCOUNTER — Ambulatory Visit (INDEPENDENT_AMBULATORY_CARE_PROVIDER_SITE_OTHER): Payer: Self-pay | Admitting: Family Medicine

## 2023-12-01 ENCOUNTER — Encounter: Payer: Self-pay | Admitting: Family Medicine

## 2023-12-01 VITALS — BP 126/84 | HR 69 | Ht 60.0 in | Wt 152.2 lb

## 2023-12-01 DIAGNOSIS — R2232 Localized swelling, mass and lump, left upper limb: Secondary | ICD-10-CM

## 2023-12-01 DIAGNOSIS — Z1231 Encounter for screening mammogram for malignant neoplasm of breast: Secondary | ICD-10-CM

## 2023-12-01 DIAGNOSIS — Z124 Encounter for screening for malignant neoplasm of cervix: Secondary | ICD-10-CM

## 2023-12-01 DIAGNOSIS — Z0000 Encounter for general adult medical examination without abnormal findings: Secondary | ICD-10-CM | POA: Insufficient documentation

## 2023-12-01 DIAGNOSIS — Z1211 Encounter for screening for malignant neoplasm of colon: Secondary | ICD-10-CM

## 2023-12-01 NOTE — Progress Notes (Signed)
Established patient visit   Patient: Nancy Howard   DOB: 1976/06/17   48 y.o. Female  MRN: [id number] Visit Date: 12/01/2023  Today's healthcare provider: Charlton Amor, DO   Chief Complaint  Patient presents with   Annual Exam    With pap     SUBJECTIVE    Chief Complaint  Patient presents with   Annual Exam    With pap    HPI HPI     Annual Exam    Additional comments: With pap       Last edited by Roselyn Reef, CMA on 12/01/2023  9:14 AM.      Pt presents for wellness exam with pap smear. She is also due for colonoscopy and mammogram.    Review of Systems  Constitutional:  Negative for activity change, fatigue and fever.  Respiratory:  Negative for cough and shortness of breath.   Cardiovascular:  Negative for chest pain.  Gastrointestinal:  Negative for abdominal pain.  Genitourinary:  Negative for difficulty urinating.       No outpatient medications have been marked as taking for the 12/01/23 encounter (Office Visit) with Charlton Amor, DO.    OBJECTIVE    BP 126/84 (BP Location: Left Arm, Patient Position: Sitting, Cuff Size: Normal)   Pulse 69   Ht 5' (1.524 m)   Wt 152 lb 4 oz (69.1 kg)   SpO2 100%   BMI 29.73 kg/m   Physical Exam Vitals and nursing note reviewed.  Constitutional:      General: She is not in acute distress.    Appearance: Normal appearance.  HENT:     Head: Normocephalic and atraumatic.     Right Ear: External ear normal.     Left Ear: External ear normal.     Nose: Nose normal.  Eyes:     Conjunctiva/sclera: Conjunctivae normal.  Cardiovascular:     Rate and Rhythm: Normal rate and regular rhythm.  Pulmonary:     Effort: Pulmonary effort is normal.     Breath sounds: Normal breath sounds.  Genitourinary:    Comments: Pap smear performed with chaperone present. No adnexal masses seen. No erythema of the cervix Neurological:     General: No focal deficit present.     Mental Status: She is alert and  oriented to person, place, and time.  Psychiatric:        Mood and Affect: Mood normal.        Behavior: Behavior normal.        Thought Content: Thought content normal.        Judgment: Judgment normal.        ASSESSMENT & PLAN    Problem List Items Addressed This Visit       Other   Arm mass, left   Relevant Orders   Ambulatory referral to Plastic Surgery   Routine adult health maintenance - Primary   Relevant Orders   Basic Metabolic Panel (BMET)   Lipid panel   Other Visit Diagnoses       Screening for cervical cancer       Relevant Orders   Cytology - PAP     Encounter for screening mammogram for malignant neoplasm of breast       Relevant Orders   MM DIGITAL SCREENING BILATERAL     Screening for colon cancer       Relevant Orders   Ambulatory referral to Gastroenterology      Pt is  self pay. Discussed with pt and her daughter about application for medicaid.  No follow-ups on file.      No orders of the defined types were placed in this encounter.   Orders Placed This Encounter  Procedures   MM DIGITAL SCREENING BILATERAL    Standing Status:   Future    Expiration Date:   11/30/2024    Is the patient pregnant?:   No    Preferred imaging location?:   [facility]    Reason for exam::   screening    Release to patient:   Immediate   Basic Metabolic Panel (BMET)    Has the patient fasted?:   No   Lipid panel    Has the patient fasted?:   No    Release to patient:   Immediate   Ambulatory referral to Plastic Surgery    Referral Priority:   Routine    Referral Type:   Surgical    Referral Reason:   Specialty Services Required    Requested Specialty:   Plastic Surgery    Number of Visits Requested:   1   Ambulatory referral to Gastroenterology    Referral Priority:   Routine    Referral Type:   Consultation    Referral Reason:   Specialty Services Required    Number of Visits Requested:   1     Charlton Amor, DO  [facility] Primary  [facility] & [facility] Medicine at [facility] [phone] (phone) [phone] (fax)  [facility]

## 2023-12-02 LAB — BASIC METABOLIC PANEL
BUN/Creatinine Ratio: 18 (ref 9–23)
BUN: 12 mg/dL (ref 6–24)
CO2: 20 mmol/L (ref 20–29)
Calcium: 9.1 mg/dL (ref 8.7–10.2)
Chloride: 105 mmol/L (ref 96–106)
Creatinine, Ser: 0.65 mg/dL (ref 0.57–1.00)
Glucose: 101 mg/dL — ABNORMAL HIGH (ref 70–99)
Potassium: 4 mmol/L (ref 3.5–5.2)
Sodium: 139 mmol/L (ref 134–144)
eGFR: 110 mL/min/{1.73_m2} (ref >59)

## 2023-12-02 LAB — LIPID PANEL
Chol/HDL Ratio: 4.3 {ratio} (ref 0.0–4.4)
Cholesterol, Total: 152 mg/dL (ref 100–199)
HDL: 35 mg/dL — ABNORMAL LOW (ref >39)
LDL Chol Calc (NIH): 87 mg/dL (ref 0–99)
Triglycerides: 173 mg/dL — ABNORMAL HIGH (ref 0–149)
VLDL Cholesterol Cal: 30 mg/dL (ref 5–40)

## 2023-12-04 ENCOUNTER — Telehealth: Payer: Self-pay | Admitting: Medical-Surgical

## 2023-12-04 LAB — CYTOLOGY - PAP
Adequacy: ABSENT
Comment: NEGATIVE
Diagnosis: NEGATIVE
High risk HPV: NEGATIVE

## 2023-12-04 NOTE — Telephone Encounter (Signed)
Copied from CRM [id number]. Topic: Referral - Question >> Dec 04, 2023  2:48 PM Maxwell Marion wrote: Reason for CRM: Santina Evans with [facility] called and asked if patient had insurance, I let her know insurance cards were faxed but she said she has not received them. Confirmed fax number with her, [phone], can insurance cards be re faxed please? Thank you.

## 2023-12-05 NOTE — Telephone Encounter (Signed)
Copied from CRM [id number]. Topic: Medical Record Request - Provider/Facility Request >> Dec 05, 2023  9:49 AM Fonda Kinder J wrote: Reason for CRM: [facility] states they still have not received the insurance information for the pt to get her scheduled. She is requesting it via fax @ [phone]

## 2023-12-07 ENCOUNTER — Other Ambulatory Visit: Payer: Self-pay | Admitting: Family Medicine

## 2023-12-07 MED ORDER — METRONIDAZOLE 500 MG PO TABS: 500.0000 mg | ORAL_TABLET | Freq: Two times a day (BID) | ORAL | 0 refills | Status: DC

## 2023-12-07 NOTE — Telephone Encounter (Signed)
12/07/23-Left message on [facility] voicemail stating patient is does not currently have insurance at this time.

## 2023-12-07 NOTE — Progress Notes (Signed)
Call patient: Metabolic panel looks good.  Triglycerides are up a little bit just continue to work on healthy diet and regular exercise.  Pap smear is normal and negative for HPV.  Recommend repeat Pap smear in 5 years.  They did see a little overgrowth of bacteria consistent with bacterial vaginitis.  This can sometimes cause a little bit of heavier discharge and sometimes a little bit of an odor.  I am going to go ahead and send over a treatment to clear that up.  It is called metronidazole and it is an antibiotic.

## 2023-12-13 ENCOUNTER — Other Ambulatory Visit: Payer: Self-pay | Admitting: Sports Medicine

## 2023-12-13 DIAGNOSIS — D1720 Benign lipomatous neoplasm of skin and subcutaneous tissue of unspecified limb: Secondary | ICD-10-CM

## 2024-01-24 ENCOUNTER — Encounter: Payer: Self-pay | Admitting: Family Medicine

## 2024-01-31 ENCOUNTER — Ambulatory Visit: Payer: Self-pay

## 2024-02-22 ENCOUNTER — Encounter: Payer: Self-pay | Admitting: Family Medicine

## 2024-06-20 ENCOUNTER — Encounter: Payer: Self-pay | Admitting: Sports Medicine

## 2024-07-30 ENCOUNTER — Encounter: Payer: Self-pay | Admitting: Urgent Care

## 2024-07-30 ENCOUNTER — Ambulatory Visit: Payer: Self-pay

## 2024-07-30 ENCOUNTER — Ambulatory Visit: Payer: Self-pay | Admitting: Urgent Care

## 2024-07-30 VITALS — BP 142/80 | HR 51 | Ht 60.0 in | Wt 147.0 lb

## 2024-07-30 DIAGNOSIS — G43009 Migraine without aura, not intractable, without status migrainosus: Secondary | ICD-10-CM

## 2024-07-30 MED ORDER — RIZATRIPTAN BENZOATE 10 MG PO TBDP: 10.0000 mg | ORAL_TABLET | ORAL | 5 refills | Status: DC | PRN

## 2024-07-30 NOTE — Telephone Encounter (Signed)
 FYI Only or Action Required?: FYI only for provider.  Patient was last seen in primary care on 12/01/2023 by Nancy Bernice RAMAN, DO.  Called Nurse Triage reporting Migraine.  Symptoms began several days ago.  Interventions attempted: Prescription medications: Imitrex  does not help.  Symptoms are: gradually worsening.  Triage Disposition: See HCP Within 4 Hours (Or PCP Triage)  Patient/caregiver understands and will follow disposition?: Yes    Copied from CRM [id number]. Topic: Clinical - Red Word Triage >> Jul 30, 2024  7:35 AM Nancy Howard wrote: Red Word that prompted transfer to Nurse Triage: Worsening migraines, difficulty opening eyes, shortness of breath, pain to the point of rolling on the floor Reason for Disposition  [1] SEVERE headache (e.g., excruciating) AND [2] not improved after 2 hours of pain medicine  Answer Assessment - Initial Assessment Questions 1. LOCATION: Where does it hurt?      Starts at top of head and goes down forehead and right side and wraps around the back. Patient reports it feels like stabbing pain. 2. ONSET: When did the headache start? (e.g., minutes, hours, days)      Started back up a month ago, has migraines roughly once a week  3. PATTERN: Does the pain come and go, or has it been constant since it started?     Comes and goes 4. SEVERITY: How bad is the pain? and What does it keep you from doing?  (e.g., Scale 1-10; mild, moderate, or severe)     Severe pain, affects sleep, causes patient to roll on the floor in pain 5. RECURRENT SYMPTOM: Have you ever had headaches before? If Yes, ask: When was the last time? and What happened that time?      Yes 6. CAUSE: What do you think is causing the headache?     Believes it to be a Migraine  7. MIGRAINE: Have you been diagnosed with migraine headaches? If Yes, ask: Is this headache similar?      Patient has been put on Imitrex  8. HEAD INJURY: Has there been any recent injury to your  head?      No 9. OTHER SYMPTOMS: Do you have any other symptoms? (e.g., fever, stiff neck, eye pain, sore throat, cold symptoms)     Patient gets lightheaded, dizzy, nauseous, shortness of breath while she is having the migraine  Protocols used: Headache-A-AH

## 2024-07-30 NOTE — Telephone Encounter (Signed)
 Patient was seen in the office today, 07/30/2024, by Benton Gave.

## 2024-07-30 NOTE — Patient Instructions (Addendum)
 Go to [employer] to use a coupon for rizatriptan. Should be about $15.00  Take one as needed for migraine. Dissolves under the tongue. Repeat in 2 hours if the first dose is ineffective.  Please monitor blood pressure. It was a little elevated today. Goal readings are 120/80. If persistently elevated >135/85 please notify me.    Visita [employer] para usar un cupn de rizatriptn. Cuesta unos $15.00.  Toma uno segn lo necesites para la migraa. Se disuelve debajo de la lengua. Repite la dosis en 2 horas si la primera dosis no surte efecto.  Por favor, controla la presin arterial. Hoy estuvo un poco elevada. El objetivo es 120/80. Si la presin arterial persiste >135/85, por favor, avsame.

## 2024-07-30 NOTE — Progress Notes (Unsigned)
   Established Patient Office Visit  Subjective:  Patient ID: Nancy Howard, female    DOB: [birth year]  Age: 48 y.o. MRN: [id number]  Chief Complaint  Patient presents with   Migraine    3+ per month; meds causing nausea    HPI  Discussed the use of AI scribe software for clinical note transcription with the patient, who gave verbal consent to proceed.  History of Present Illness   Nancy Howard is a 48 year old female who presents with recurrent migraines.  She experiences severe headaches resembling migraines several times a month, persisting for approximately two years. The pain is intense and stabbing, radiating throughout her body, and is accompanied by nausea and shortness of breath. These symptoms occur simultaneously with the headaches.  She was previously prescribed sumatriptan , which provided some relief, but she has run out of the medication. The headaches occur about three times a month, roughly once a week. She is unable to identify specific triggers for the migraines.  No changes in vision, but she confirms experiencing nausea and shortness of breath during the headaches. She does not currently take any daily medications, including Zyrtec , Flexeril , fluticasone, hydroxyzine , meloxicam , or antibiotics.  She does not smoke and does not consume wine.      {History (Optional):23778}  ROS: as noted in HPI  Objective:     BP (!) 142/80   Pulse (!) 51   Ht 5' (1.524 m)   Wt 147 lb (66.7 kg)   SpO2 99%   BMI 28.71 kg/m  BP Readings from Last 3 Encounters:  07/30/24 (!) 142/80  12/01/23 126/84  11/17/23 139/86   Wt Readings from Last 3 Encounters:  07/30/24 147 lb (66.7 kg)  12/01/23 152 lb 4 oz (69.1 kg)  11/17/23 151 lb 9 oz (68.7 kg)      Physical Exam   No results found for any visits on 07/30/24.  {Labs (Optional):23779}  The 10-year ASCVD risk score (Arnett DK, et al., 2019) is: 1.5%  Assessment & Plan:  Migraine without aura and  without status migrainosus, not intractable -     Rizatriptan Benzoate; Take 1 tablet (10 mg total) by mouth as needed for migraine. May repeat in 2 hours if needed  Dispense: 12 tablet; Refill: 5  Assessment and Plan    Migraine with associated nausea and shortness of breath Chronic migraines, three times monthly, with nausea and shortness of breath. Sumatriptan  ineffective and caused nausea. No headache at present. Rizatriptan preferred due to better formulation and effectiveness. - Prescribed rizatriptan 10 mg orally disintegrating tablets, 12 tablets with 5 refills, to be taken at onset of migraine. Max 2 tablets in 24 hours. - Advised to keep a migraine diary to identify potential triggers such as diet, sleep, and menstruation. - Instructed to monitor frequency of migraines and return if frequency increases to 3-4 times per week for consideration of preventive medication.  Elevated blood pressure, not diagnosed as hypertension Blood pressure 142/80 mmHg, higher than previous readings. No consistent hypertension history. Current elevation may be situational. - Advised to monitor blood pressure weekly, especially at [facility]. - Instructed to report if blood pressure consistently exceeds 140/90 mmHg for further evaluation.         No follow-ups on file.   Benton LITTIE Gave, PA

## 2024-07-31 ENCOUNTER — Encounter: Payer: Self-pay | Admitting: Urgent Care

## 2024-10-18 ENCOUNTER — Ambulatory Visit: Payer: Self-pay

## 2024-10-18 NOTE — Telephone Encounter (Signed)
 FYI Only or Action Required?: FYI only for provider: appointment scheduled on 10/19/2024. Virtual  Patient was last seen in primary care on 07/30/2024 by Lowella Folks L, PA.  Called Nurse Triage reporting Influenza.  Symptoms began yesterday.  Interventions attempted: OTC medications: ibuprofen .  Symptoms are: stable.  Triage Disposition: Home Care  Patient/caregiver understands and will follow disposition?:   Copied from CRM [id number]. Topic: Clinical - Red Word Triage >> Oct 18, 2024  2:23 PM Delon HERO wrote: Red Word that prompted transfer to Nurse Triage: Patient's daughter is calling to report body aches, severe headache, temperature, & vomiting that started yesterday. Reason for Disposition  [1] Probable influenza (fever) with no complications AND [2] NOT HIGH RISK  Answer Assessment - Initial Assessment Questions Told patient daughter they could treat at home but I could schedule if they wanted. They do want appointment. Daughter will not be with her, will need translator. No appointments left today. Virtual appointment scheduled for 10/19/2024. Told patient she could go to UC if needed before then.  1. SYMPTOMS: What is your main symptom or concern? (e.g., cough, fever, shortness of breath, muscle aches)     Body aches, severe headache, feels like she has a fever, nausea and vomiting. 2. ONSET: When did the symptoms start?      yesterday 3. COUGH: Do you have a cough? If Yes, ask: How bad is the cough?       Yes dry cough 4. FEVER: Do you have a fever? If Yes, ask: What is your temperature, how was it measured, and when did it start?     Yes, feels fever but has not checked temperature. 5. BREATHING DIFFICULTY: Are you having any difficulty breathing? (e.g., normal; shortness of breath, wheezing, unable to speak)      Denies 7. OTHER SYMPTOMS: Do you have any other symptoms?  (e.g., chills, fatigue, headache, loss of smell or taste, muscle pain, sore throat)      Fatigue, headaches, muscle pains, N&V,  8. INFLUENZA EXPOSURE: Was there any known exposure to influenza (flu) before the symptoms began?      Denies 10. INFLUENZA VACCINE: Have you had the flu vaccine? If Yes, ask: When did you last get it?       Yes 11. HIGH RISK FOR COMPLICATIONS: Do you have any chronic medical problems? (e.g., asthma, heart or lung disease, obesity, weak immune system)       Denies  Protocols used: Influenza (Flu) Suspected-A-AH

## 2024-10-19 ENCOUNTER — Telehealth: Payer: Self-pay | Admitting: Family Medicine

## 2024-10-19 NOTE — Progress Notes (Signed)
 Pt did not show for visit [facility]

## 2024-10-21 NOTE — Telephone Encounter (Signed)
 Attempted call to patient via interpreter # [phone] [facility] Person who answers ( not daughter or listed on HIPPA) states that Patient has gone to work and will not get off until 5pm and that her daughter Raoul is also at work today.

## 2024-10-23 ENCOUNTER — Ambulatory Visit: Payer: Self-pay

## 2024-10-23 NOTE — Telephone Encounter (Signed)
 Second callback attempt. LVM to call office back.     Copied from CRM #[id number]. Topic: Clinical - Red Word Triage >> Oct 23, 2024 11:46 AM Nancy Howard wrote: Red Word that prompted transfer to Nurse Triage:  She has been having left eye watering for about 1 week and it is worse. Pain level at a 7. She also has a coughing that has been going on for about a week. >> Oct 23, 2024 12:01 PM Nancy Howard wrote: Please call Nancy Howard back at [phone]. She did not want to wait on hold any longer

## 2024-10-23 NOTE — Telephone Encounter (Signed)
 First attempt to contact pt daughter as requested, no answer, LVM for call back to PCP office. Placed in call back.   Copied from CRM #[id number]. Topic: Clinical - Red Word Triage >> Oct 23, 2024 11:46 AM Montie POUR wrote: Red Word that prompted transfer to Nurse Triage:  She has been having left eye watering for about 1 week and it is worse. Pain level at a 7. She also has a coughing that has been going on for about a week. >> Oct 23, 2024 12:01 PM Montie POUR wrote: Please call Rosa back at [phone]. She did not want to wait on hold any longer

## 2024-10-24 NOTE — Telephone Encounter (Signed)
 Spoke with patient's daughter Hadassah - patient is scheduled with Vermell Bologna , PA for 10/24/2024 At 3:00pm

## 2024-10-24 NOTE — Telephone Encounter (Signed)
 The patient has been scheduled on 10/25/24 at 3 pm with Jade to follow up on the following symptoms.

## 2024-10-24 NOTE — Telephone Encounter (Signed)
 Daughter has no further questions and is aware of the appointment scheduled for tomorrow at 300 pm

## 2024-10-24 NOTE — Telephone Encounter (Signed)
 Copied from CRM [id number]. Topic: General - Other >> Oct 23, 2024  4:46 PM Travis F wrote: Reason for CRM: Patient's daughter called in returning a call from Nurse Triage.

## 2024-10-25 ENCOUNTER — Ambulatory Visit: Payer: Self-pay | Admitting: Physician Assistant

## 2024-10-25 VITALS — BP 132/80 | HR 66 | Ht 60.0 in | Wt 149.0 lb

## 2024-10-25 DIAGNOSIS — R6889 Other general symptoms and signs: Secondary | ICD-10-CM

## 2024-10-25 DIAGNOSIS — G43009 Migraine without aura, not intractable, without status migrainosus: Secondary | ICD-10-CM

## 2024-10-25 DIAGNOSIS — J069 Acute upper respiratory infection, unspecified: Secondary | ICD-10-CM

## 2024-10-25 DIAGNOSIS — B309 Viral conjunctivitis, unspecified: Secondary | ICD-10-CM

## 2024-10-25 LAB — POC SOFIA 2 FLU + SARS ANTIGEN FIA
Influenza A, POC: NEGATIVE
Influenza B, POC: NEGATIVE
SARS Coronavirus 2 Ag: NEGATIVE

## 2024-10-25 MED ORDER — RIZATRIPTAN BENZOATE 10 MG PO TBDP: 10.0000 mg | ORAL_TABLET | ORAL | 5 refills | Status: AC | PRN

## 2024-10-25 MED ORDER — PROMETHAZINE-DM 6.25-15 MG/5ML PO SYRP: 5.0000 mL | ORAL_SOLUTION | Freq: Four times a day (QID) | ORAL | 0 refills | Status: AC | PRN

## 2024-10-25 MED ORDER — BENZONATATE 200 MG PO CAPS: 200.0000 mg | ORAL_CAPSULE | Freq: Three times a day (TID) | ORAL | 0 refills | Status: AC | PRN

## 2024-10-25 MED ORDER — AZELASTINE HCL 0.05 % OP SOLN: 1.0000 [drp] | Freq: Two times a day (BID) | OPHTHALMIC | 0 refills | Status: AC

## 2024-10-25 NOTE — Patient Instructions (Signed)
 [place]  pearls during the day Optivar  drops for next week for eyes Cough syrup as needed but can make sleepy  Infeccin de las vas respiratorias superiores en adultos Upper Respiratory Infection, Adult Una infeccin de las vas respiratorias superiores (IVRS) afecta la nariz, la garganta y las vas respiratorias superiores que llegan a los pulmones. El tipo ms comn de IVRS suele conocerse como el resfro comn. Las IVRS generalmente mejoran solas, sin tratamiento mdico. Cules son las causas? La causa de las IVRS es un germen (virus). Puede contraer estos grmenes de las siguientes maneras: Al aspirar las gotitas que una persona infectada elimina al toser o engineering geologist. Al tocar algo que tiene el germen (est contaminado) y luego tocarse la boca, la nariz o los ojos. Qu incrementa el riesgo? Es ms propenso a health and safety inspector IVRS si: Es muy pequeo o de edad muy [place]. Tiene contacto cercano con otros, como en el [place], la escuela o un centro de atencin mdica. Fuma. Tiene una enfermedad cardaca o pulmonar a largo plazo (crnica). Tiene debilitado el sistema encargado de combatir las enfermedades (sistema inmunitario). Tiene asma o alergias nasales. Tiene mucho estrs. Tiene un dficit nutricional. Cules son los signos o sntomas? Secrecin nasal o nariz tapada (congestin). Tos. Estornudos. Dolor de advertising copywriter. Dolor de cabeza. Sensacin de cansancio (fatiga). Lajune. No querer comer tanto como lo hace habitualmente. Dolor en la frente, detrs de los ojos y por encima de los pmulos (dolor sinusal). Dolores musculares. Enrojecimiento o irritacin de los ojos. Presin en los odos o la cara. Cmo se trata? Las IVRS generalmente mejoran por s solas en un perodo de entre 7 y [address]. Los medicamentos no curan las IVRS, pero el mdico puede recomendarle ciertos medicamentos para ayudar a [employer], como por ejemplo: Medicamentos para la tos de [place]. Medicamentos para reducir la tos (antitusivos). La tos es un tipo de defensa contra las infecciones que ayuda a museum/gallery conservator la nariz, la garganta, la trquea y los pulmones (el sistema respiratorio). Tome estos medicamentos solamente como se lo haya indicado el mdico. Medicamentos para bajar la [place]. Siga estas instrucciones en su casa: Actividad Descanse todo lo que sea necesario. Si tiene fiebre, [facility] [facility], sin ir al veda o a la escuela, hasta que ya no tenga fiebre, o hasta que el mdico le indique que puede regresar al veda o a la escuela. Debe permanecer en su casa hasta que ya no pueda propagar (contagiar) la infeccin. Es posible que el mdico le indique que use una mascarilla para tener menos riesgo de propagar la infeccin. Para aliviar los sntomas Enjuguese la boca frecuentemente con una mezcla de agua con sal. Para preparar agua con sal, disuelva de  a 1 cucharadita (de 3 a 6 g) de sal en 1 taza (237 ml) de agua tibia. Use un humidificador de aire fro para agregar humedad al aire. Esto puede ayudarlo a que respire mejor. Comida y bebida  Beba suficiente lquido para mantener la orina de color amarillo plido. Tome sopas y caldos transparentes. Instrucciones generales  Use los medicamentos de venta libre y los recetados solamente como se lo haya indicado el mdico. No fume ni consuma ningn producto que contenga nicotina o tabaco. Si necesita ayuda para dejar de fumar, consulte al mdico. Evite estar cerca de personas que fuman (evite el humo ambiental de tabaco). Mantngase al da con todas las vacunas (inmunizaciones) y aplquese la vacuna contra la gripe todos los [place]. Concurra a todas las visitas  de seguimiento. Cmo evitar contagiar la infeccin a otros  Lvese las manos con agua y jabn durante al menos 20 segundos. Use un desinfectante para manos si no dispone de agua y jabn. Evite tocarse la boca, la cara, los ojos o la [place]. Tosa o  estornude en un pauelo de papel o sobre su manga o codo. No tosa o estornude al aire ni se cubra la boca o la nariz con la [place]. Comunquese con un mdico si: Siente que empeora o que no mejora. Tiene alguno de estos sntomas: Lajune o escalofros. Mucosidad color marrn o roja en la nariz. Lquido amarillento o amarronado (doctor, hospital de la clinical cytogeneticist. Dolor en la cara, especialmente al inclinarse hacia adelante. Ganglios del cuello inflamados. Dolor al tragar. Zonas blancas en la parte de atrs de la garganta. Solicite ayuda de inmediato si: La falta de aire empeora. Los siguientes sntomas son muy intensos o constantes: Dolor de cabeza. Dolor de odo. Dolor en la frente, detrs de los ojos y por encima de los pmulos (dolor sinusal). Dolor de pecho. Tiene una enfermedad pulmonar prolongada (crnica) junto con cualquiera de estos sntomas: Emitir sonidos de silbidos agudos al respirar, ms a menudo al exhalar (sibilancias). Tos prolongada (ms de [address]). Tos con sangre. Cambio en la mucosidad habitual. Tiene rigidez en el cuello. Tiene cambios en: La visin. La audicin. El razonamiento. El [place] de nimo. Estos sntomas pueden customer service manager. Solicite ayuda de inmediato. Llame al 911. No espere a ver si los sntomas desaparecen. No conduzca por sus propios medios [facility]. Resumen Una infeccin de las vas respiratorias superiores (IVRS) es causada por un germen (virus). El tipo ms comn de IVRS suele conocerse como el resfro comn. Una IVRS suele mejorar en el transcurso de 7 a 10 das. Use los medicamentos de venta libre y los recetados solamente como se lo haya indicado el mdico. Esta informacin no tiene theme park manager el consejo del mdico. Asegrese de hacerle al mdico cualquier pregunta que tenga. Document Revised: 05/31/2021 Document Reviewed: 05/31/2021 Elsevier Patient Education  2024 [employer].

## 2024-10-28 ENCOUNTER — Encounter: Payer: Self-pay | Admitting: Physician Assistant

## 2024-10-28 NOTE — Progress Notes (Signed)
 "  Acute Office Visit  Subjective:     Patient ID: Nancy Howard, female    DOB: [birth year], 49 y.o.   MRN: [id number]  Chief Complaint  Patient presents with   Cough    HPI Discussed the use of AI scribe software for clinical note transcription with the patient, who gave verbal consent to proceed.  History of Present Illness Nancy Howard is a 49 year old female who presents with body aches, cough, and watery eyes. She is accompanied by her daughter to acts as her historian.   Constitutional and respiratory symptoms - Body aches, cough, and watery eyes since October 18, 2024 - No throat pain or fever - Clear nasal discharge - Symptoms are worse at night, interfering with sleep - No over-the-counter medications taken for current symptoms; has been drinking tea  Ocular symptoms - Persistent tearing throughout the day, primarily affecting the left eye - No crusting or drying of the eyes - Difficulty with light exposure, which exacerbates symptoms  Migraine headaches - History of migraines - Uses Maxalt  as a rescue medication - In need of a refill for Maxalt    ROS See HPI.     Objective:    BP 132/80   Pulse 66   Ht 5' (1.524 m)   Wt 149 lb (67.6 kg)   SpO2 99%   BMI 29.10 kg/m  BP Readings from Last 3 Encounters:  10/25/24 132/80  07/30/24 (!) 142/80  12/01/23 126/84   Wt Readings from Last 3 Encounters:  10/25/24 149 lb (67.6 kg)  07/30/24 147 lb (66.7 kg)  12/01/23 152 lb 4 oz (69.1 kg)      Physical Exam Constitutional:      Appearance: Normal appearance.  HENT:     Head: Normocephalic.     Comments: No sinus tenderness to palpation.    Right Ear: Tympanic membrane, ear canal and external ear normal. There is no impacted cerumen.     Left Ear: Tympanic membrane, ear canal and external ear normal. There is no impacted cerumen.     Nose: Nose normal. No rhinorrhea.     Mouth/Throat:     Mouth: Mucous membranes are moist.     Pharynx: No  oropharyngeal exudate or posterior oropharyngeal erythema.  Eyes:     General:        Right eye: Discharge present.        Left eye: Discharge present.    Comments: Watery discharge with slightly injected conjunctiva of both eyes.   Cardiovascular:     Rate and Rhythm: Normal rate and regular rhythm.  Pulmonary:     Effort: Pulmonary effort is normal.     Breath sounds: Normal breath sounds. No wheezing or rhonchi.  Neurological:     General: No focal deficit present.     Mental Status: She is alert and oriented to person, place, and time.  Psychiatric:        Mood and Affect: Mood normal.     Results for orders placed or performed in visit on 10/25/24  POC SOFIA 2 FLU + SARS ANTIGEN FIA  Result Value Ref Range   Influenza A, POC Negative Negative   Influenza B, POC Negative Negative   SARS Coronavirus 2 Ag Negative Negative        Assessment & Plan:  SABRASABRAJaidy was seen today for cough.  Diagnoses and all orders for this visit:  Flu-like symptoms -     POC SOFIA 2 FLU + SARS  ANTIGEN FIA  Migraine without aura and without status migrainosus, not intractable -     rizatriptan  (MAXALT -MLT) 10 MG disintegrating tablet; Take 1 tablet (10 mg total) by mouth as needed for migraine. May repeat in 2 hours if needed  Viral URI with cough -     benzonatate  (TESSALON ) 200 MG capsule; Take 1 capsule (200 mg total) by mouth 3 (three) times daily as needed. -     promethazine -dextromethorphan (PROMETHAZINE -DM) 6.25-15 MG/5ML syrup; Take 5 mLs by mouth 4 (four) times daily as needed.  Viral conjunctivitis, both eyes -     azelastine  (OPTIVAR ) 0.05 % ophthalmic solution; Place 1 drop into both eyes 2 (two) times daily.    Assessment & Plan Acute upper respiratory infection Symptoms improving, likely viral origin. Negative for flu and COVID. - Prescribed anti-inflammatory eye drops for left eye watering. - Provided cough syrup for symptomatic relief. - Prescribed day-time cough  pearls as needed. - Reassurance given with timeline of viral illnesses. - Follow up if symptoms worsening or changing or not resolving.   Migraine without aura Uses Maxalt  for rescue treatment. - Refilled Maxalt  for migraine rescue treatment.     Return if symptoms worsen or fail to improve.  Rooney Gladwin, PA-C   "
